# Patient Record
Sex: Female | Born: 1955 | Race: White | Hispanic: No | Marital: Married | Smoking: Never smoker
Health system: Southern US, Community
[De-identification: ages and names within clinical notes are randomized; demographics above are authoritative.]

## PROBLEM LIST (undated history)

## (undated) DIAGNOSIS — E559 Vitamin D deficiency, unspecified: Secondary | ICD-10-CM

## (undated) DIAGNOSIS — G4730 Sleep apnea, unspecified: Secondary | ICD-10-CM

## (undated) DIAGNOSIS — K9041 Non-celiac gluten sensitivity: Secondary | ICD-10-CM

## (undated) DIAGNOSIS — N814 Uterovaginal prolapse, unspecified: Secondary | ICD-10-CM

## (undated) DIAGNOSIS — G20 Parkinson's disease: Secondary | ICD-10-CM

## (undated) DIAGNOSIS — K219 Gastro-esophageal reflux disease without esophagitis: Secondary | ICD-10-CM

## (undated) DIAGNOSIS — N6019 Diffuse cystic mastopathy of unspecified breast: Secondary | ICD-10-CM

## (undated) DIAGNOSIS — G20C Parkinsonism, unspecified: Secondary | ICD-10-CM

## (undated) DIAGNOSIS — N63 Unspecified lump in unspecified breast: Secondary | ICD-10-CM

## (undated) DIAGNOSIS — Z800 Family history of malignant neoplasm of digestive organs: Secondary | ICD-10-CM

## (undated) DIAGNOSIS — E785 Hyperlipidemia, unspecified: Secondary | ICD-10-CM

## (undated) DIAGNOSIS — F329 Major depressive disorder, single episode, unspecified: Secondary | ICD-10-CM

## (undated) DIAGNOSIS — R413 Other amnesia: Secondary | ICD-10-CM

## (undated) DIAGNOSIS — R259 Unspecified abnormal involuntary movements: Secondary | ICD-10-CM

## (undated) DIAGNOSIS — M109 Gout, unspecified: Secondary | ICD-10-CM

## (undated) DIAGNOSIS — M1120 Other chondrocalcinosis, unspecified site: Secondary | ICD-10-CM

## (undated) DIAGNOSIS — K4090 Unilateral inguinal hernia, without obstruction or gangrene, not specified as recurrent: Secondary | ICD-10-CM

## (undated) DIAGNOSIS — R51 Headache: Secondary | ICD-10-CM

## (undated) DIAGNOSIS — M8580 Other specified disorders of bone density and structure, unspecified site: Secondary | ICD-10-CM

## (undated) DIAGNOSIS — R0789 Other chest pain: Secondary | ICD-10-CM

## (undated) DIAGNOSIS — F32A Depression, unspecified: Secondary | ICD-10-CM

## (undated) DIAGNOSIS — D649 Anemia, unspecified: Secondary | ICD-10-CM

## (undated) DIAGNOSIS — G4700 Insomnia, unspecified: Secondary | ICD-10-CM

## (undated) DIAGNOSIS — R0600 Dyspnea, unspecified: Secondary | ICD-10-CM

## (undated) DIAGNOSIS — Z803 Family history of malignant neoplasm of breast: Secondary | ICD-10-CM

## (undated) DIAGNOSIS — R519 Headache, unspecified: Secondary | ICD-10-CM

## (undated) DIAGNOSIS — F419 Anxiety disorder, unspecified: Secondary | ICD-10-CM

## (undated) DIAGNOSIS — M542 Cervicalgia: Secondary | ICD-10-CM

## (undated) DIAGNOSIS — Z8489 Family history of other specified conditions: Secondary | ICD-10-CM

## (undated) DIAGNOSIS — Z9889 Other specified postprocedural states: Secondary | ICD-10-CM

## (undated) DIAGNOSIS — K639 Disease of intestine, unspecified: Secondary | ICD-10-CM

## (undated) DIAGNOSIS — R29818 Other symptoms and signs involving the nervous system: Secondary | ICD-10-CM

## (undated) DIAGNOSIS — R112 Nausea with vomiting, unspecified: Secondary | ICD-10-CM

## (undated) HISTORY — PX: ABDOMINAL HYSTERECTOMY: SHX81

## (undated) HISTORY — PX: COLPOPEXY: SHX920

## (undated) HISTORY — DX: Diffuse cystic mastopathy of unspecified breast: N60.19

## (undated) HISTORY — DX: Other chondrocalcinosis, unspecified site: M11.20

## (undated) HISTORY — DX: Depression, unspecified: F32.A

## (undated) HISTORY — PX: OTHER SURGICAL HISTORY: SHX169

## (undated) HISTORY — DX: Family history of malignant neoplasm of digestive organs: Z80.0

## (undated) HISTORY — DX: Gout, unspecified: M10.9

## (undated) HISTORY — DX: Headache: R51

## (undated) HISTORY — PX: CHOLECYSTECTOMY: SHX55

## (undated) HISTORY — DX: Headache, unspecified: R51.9

## (undated) HISTORY — DX: Disease of intestine, unspecified: K63.9

## (undated) HISTORY — DX: Hyperlipidemia, unspecified: E78.5

## (undated) HISTORY — DX: Family history of malignant neoplasm of breast: Z80.3

## (undated) HISTORY — DX: Unspecified lump in unspecified breast: N63.0

## (undated) HISTORY — PX: CYSTOURETHROSCOPY: SHX476

## (undated) HISTORY — DX: Anxiety disorder, unspecified: F41.9

## (undated) HISTORY — DX: Major depressive disorder, single episode, unspecified: F32.9

---

## 1990-11-27 HISTORY — PX: BREAST LUMPECTOMY: SHX2

## 1996-11-27 DIAGNOSIS — K639 Disease of intestine, unspecified: Secondary | ICD-10-CM

## 1996-11-27 HISTORY — DX: Disease of intestine, unspecified: K63.9

## 2002-11-27 DIAGNOSIS — M109 Gout, unspecified: Secondary | ICD-10-CM

## 2002-11-27 HISTORY — DX: Gout, unspecified: M10.9

## 2004-11-27 HISTORY — PX: BREAST BIOPSY: SHX20

## 2005-01-20 ENCOUNTER — Ambulatory Visit: Payer: Self-pay | Admitting: General Surgery

## 2005-08-25 ENCOUNTER — Ambulatory Visit: Payer: Self-pay | Admitting: General Surgery

## 2005-12-06 ENCOUNTER — Ambulatory Visit: Payer: Self-pay | Admitting: General Surgery

## 2006-10-05 ENCOUNTER — Ambulatory Visit: Payer: Self-pay | Admitting: Family Medicine

## 2006-11-27 HISTORY — PX: COLONOSCOPY: SHX174

## 2006-12-10 ENCOUNTER — Ambulatory Visit: Payer: Self-pay | Admitting: General Surgery

## 2007-06-18 ENCOUNTER — Ambulatory Visit: Payer: Self-pay | Admitting: General Surgery

## 2007-11-28 HISTORY — PX: BIOPSY BREAST: PRO8

## 2007-12-06 ENCOUNTER — Ambulatory Visit: Payer: Self-pay | Admitting: General Surgery

## 2008-12-02 ENCOUNTER — Ambulatory Visit: Payer: Self-pay | Admitting: General Surgery

## 2009-12-09 ENCOUNTER — Ambulatory Visit: Payer: Self-pay | Admitting: General Surgery

## 2010-03-01 ENCOUNTER — Encounter: Payer: Self-pay | Admitting: Internal Medicine

## 2010-03-07 ENCOUNTER — Encounter: Payer: Self-pay | Admitting: Internal Medicine

## 2010-03-21 ENCOUNTER — Ambulatory Visit: Payer: Self-pay | Admitting: Internal Medicine

## 2010-03-21 DIAGNOSIS — R109 Unspecified abdominal pain: Secondary | ICD-10-CM | POA: Insufficient documentation

## 2010-03-24 ENCOUNTER — Ambulatory Visit: Payer: Self-pay | Admitting: Internal Medicine

## 2010-03-24 ENCOUNTER — Encounter: Payer: Self-pay | Admitting: Internal Medicine

## 2010-03-24 ENCOUNTER — Ambulatory Visit: Payer: Self-pay | Admitting: Cardiology

## 2010-04-01 ENCOUNTER — Telehealth: Payer: Self-pay | Admitting: Cardiovascular Disease

## 2010-04-06 ENCOUNTER — Encounter: Payer: Self-pay | Admitting: Internal Medicine

## 2010-04-19 ENCOUNTER — Ambulatory Visit: Payer: Self-pay | Admitting: Neurology

## 2010-11-27 DIAGNOSIS — N63 Unspecified lump in unspecified breast: Secondary | ICD-10-CM

## 2010-11-27 HISTORY — DX: Unspecified lump in unspecified breast: N63.0

## 2010-12-12 ENCOUNTER — Ambulatory Visit: Payer: Self-pay | Admitting: General Surgery

## 2010-12-27 NOTE — Progress Notes (Signed)
Summary: PHI  PHI   Imported By: Harlon Flor 03/22/2010 14:12:23  _____________________________________________________________________  External Attachment:    Type:   Image     Comment:   External Document

## 2010-12-27 NOTE — Assessment & Plan Note (Signed)
Summary: NP6/AMD   Referring Provider:  Jennell Corner Primary Provider:  Elizabeth Sauer  CC:  Chest pain and neck pain-last episode 3 days; Headaches;.  History of Present Illness: Margaret Roberson is a 55 y/o woman with h/o hyperlipidemia but no known h/o heart disease. Referred by Dr. Yetta Barre for further evaluation of CP.   Has never had a stress test or cath. In 1994 wore a heart monitor for tachypalpitations. Took medications for a while but then stopped.  Under a lot of stress lately home schooling her teenage son and also taking care of her husband who had a stroke in 2009 and is now on disablity.   Over past 2 months has had episodes of feeling dizzy and lightheaded. Can happen at anytime. Sits down and takes a break and then goes away. Over same time has had "pinching" feeling in chest and can occasionally get a pain in her neck which can with her chest pinching or by itself. Can occur with exertion or at rest. Occasionally happens at night and can wake her up from sleep with clutching pain. Denies acid reflux. Overall feels symptoms are about the same over past 2 months.   Saw Dr. Feliberto Gottron who referred to [place] to evalaute her CP.  Preventive Screening-Counseling & Management  Alcohol-Tobacco     Smoking Status: never  Caffeine-Diet-Exercise     Caffeine use/day: 1  Current Medications (verified): 1)  Vitamin D (Ergocalciferol) 50000 Unit Caps (Ergocalciferol) .... Take 1 By Mouth Every Week For 6 Weeks 2)  Toviaz 4 Mg Xr24h-Tab (Fesoterodine Fumarate) .... Take 1 By Mouth Once Daily 3)  Tylenol Extra Strength 500 Mg Tabs (Acetaminophen) .... As Needed 4)  Womens Menopause Vita Pak  Misc (Specialty Vitamins Products) .... Once Daily 5)  Niacin 250 Mg Tabs (Niacin) .... Take 1 By Mouth Two Times A Day 6)  Fish Oil 1000 Mg Caps (Omega-3 Fatty Acids) .... Take 1 By Mouth Three Times A Day  Allergies: 1)  ! Sulfa 2)  ! Penicillin  Past History:  Past Medical History: 1.  Headache disorder, right-sided, etiology unknown. 2. Urinary incontinence. 3. Hyperlipidemia. 4 Fibrocystic changes to her breasts. 5. Pseudogout  Past Surgical History: Lumpectomy (L) 1992- BENIGN C-Section: 1994 (R) Breast Biopsy: 2009  Family History: Family History of Coronary Artery Disease:  Brother with heart disease and dissecting abdominal aneurysm age 71 Mother with a heart attack age 15  Family History of CVA or Stroke:  Brother age 52  Family History of Cancer:  Sister with breast cancer Brother with colon cancer  Social History: Caffeine use/day:  1  Review of Systems       As per HPI and past medical history; otherwise all systems negative.   Vital Signs:  Patient profile:   55 year old female Height:      62 inches Weight:      157 pounds BMI:     28.82 Pulse rate:   96 / minute BP sitting:   110 / 70  (left arm)  Vitals Entered By: Stanton Kidney, EMT-P (March 21, 2010 2:28 PM)  Physical Exam  General:  Gen: well appearing. no resp difficulty HEENT: normal Neck: supple. no JVD. Carotids 2+ bilat; no bruits. No lymphadenopathy or thryomegaly appreciated. Cor: PMI nondisplaced. Regular rate & rhythm. No rubs, gallops, murmur. Lungs: clear Abdomen: soft, nontender, nondistended. No hepatosplenomegaly. No bruits or masses. Good bowel sounds. Extremities: no cyanosis, clubbing, rash, edema Neuro: alert & orientedx3, cranial nerves grossly intact. moves  all 4 extremities w/o difficulty. affect pleasant - mildly stressed    Impression & Recommendations:  Problem # 1:  CHEST TIGHTNESS-PRESSURE-OTHER ([id number]) Chest pain with moswtly atypical features however given risk factors feel it is prudent to proceed with treadmill myoview to further evalaute.   Other Orders: T-Pregnancy (Serum), Quant. [phone]) T-Pregnancy (Serum), Quant. [phone]) Nuclear Stress Test (Nuc Stress Test)  Patient Instructions: 1)  Your physician recommends that  you schedule a follow-up appointment in: 2 months 2)  Your physician recommends that you continue on your current medications as directed. Please refer to the Current Medication list given to you today. 3)  Your physician has requested that you have an exercise stress myoview.  For further information please visit [email or web address]  Nothing to eat or drink after midnight.  Please wear comfortable clothes, short sleeved shirt, comfortable shoes. Please arrive @ 7:30 am to [facility] medical mall entrance.  You will be there approx. 2-3 hours.

## 2010-12-27 NOTE — Progress Notes (Signed)
Summary: RESULTS  Phone Note Call from Patient Call back at Home Phone [phone]   Caller: SELF Call For: [facility] Summary of Call: PT WOULD LIKE HER STRESS TEST RESULTS FROM 4/27 Initial call taken by: Harlon Flor,  Apr 01, 2010 1:49 PM  Follow-up for Phone Call        PT CALLED BACK STATING SHE STILL HAD NOT RECEIVED RESULTS OF TEST Follow-up by: Park Breed,  Apr 04, 2010 12:10 PM    Additional Follow-up for Phone Call Additional follow up Details #2::    Stress test results not in computer. Was it done at [place] or [place]? Tim   Appended Document: RESULTS Spoke with pt advised results not read yet.  Test done at [facility] 03/23/10.  Advised WCB once results read.  Cell # alternate if not at home # [phone]. EWJ  Appended Document: RESULTS Called spoke with pt advised of normal [place] Myoview results.  EWJ

## 2010-12-27 NOTE — Progress Notes (Signed)
Summary: [facility]   Imported By: Harlon Flor 03/22/2010 14:11:14  _____________________________________________________________________  External Attachment:    Type:   Image     Comment:   External Document

## 2010-12-27 NOTE — Miscellaneous (Signed)
Summary: Myoview results [facility]  Clinical Lists Changes  Observations: Added new observation of NUCST CONC: Normal LV systolic function.  Noevidence for ischemia or infarction by perfusion images.  EKG stress also showed no evidence for ischemia.  However, the patient did develop chest discomfort at peak stress that persisted four minutes into recovery before resolving.  Therefore, the patient had reproduction of her symptoms; however, there was no evidence for ischemia by EKG or Myoview. (03/24/2010 12:29)      Nuclear ETT  Procedure date:  03/24/2010  Findings:      Normal LV systolic function.  Noevidence for ischemia or infarction by perfusion images.  EKG stress also showed no evidence for ischemia.  However, the patient did develop chest discomfort at peak stress that persisted four minutes into recovery before resolving.  Therefore, the patient had reproduction of her symptoms; however, there was no evidence for ischemia by EKG or Myoview.

## 2011-06-15 ENCOUNTER — Encounter: Payer: Self-pay | Admitting: Cardiovascular Disease

## 2012-01-03 ENCOUNTER — Ambulatory Visit: Payer: Self-pay | Admitting: General Surgery

## 2013-01-03 ENCOUNTER — Ambulatory Visit: Payer: Self-pay | Admitting: General Surgery

## 2013-06-02 ENCOUNTER — Encounter: Payer: Self-pay | Admitting: *Deleted

## 2013-06-02 DIAGNOSIS — N63 Unspecified lump in unspecified breast: Secondary | ICD-10-CM | POA: Insufficient documentation

## 2014-01-09 ENCOUNTER — Encounter: Payer: Self-pay | Admitting: General Surgery

## 2014-01-14 ENCOUNTER — Ambulatory Visit: Payer: Self-pay | Admitting: General Surgery

## 2014-02-03 ENCOUNTER — Encounter: Payer: Self-pay | Admitting: *Deleted

## 2014-03-09 DIAGNOSIS — M542 Cervicalgia: Secondary | ICD-10-CM

## 2014-03-09 HISTORY — DX: Cervicalgia: M54.2

## 2014-09-28 ENCOUNTER — Encounter: Payer: Self-pay | Admitting: *Deleted

## 2015-03-22 HISTORY — PX: COLPORRHAPHY: SHX921

## 2016-01-20 ENCOUNTER — Other Ambulatory Visit: Payer: Self-pay | Admitting: Family Medicine

## 2016-01-20 DIAGNOSIS — Z1231 Encounter for screening mammogram for malignant neoplasm of breast: Secondary | ICD-10-CM

## 2016-05-31 ENCOUNTER — Other Ambulatory Visit: Payer: Self-pay | Admitting: Neurology

## 2016-05-31 DIAGNOSIS — G43119 Migraine with aura, intractable, without status migrainosus: Secondary | ICD-10-CM

## 2016-06-20 ENCOUNTER — Ambulatory Visit: Payer: BLUE CROSS/BLUE SHIELD

## 2016-07-10 ENCOUNTER — Ambulatory Visit: Payer: BLUE CROSS/BLUE SHIELD

## 2016-07-27 ENCOUNTER — Other Ambulatory Visit: Payer: Self-pay | Admitting: Student

## 2016-07-27 DIAGNOSIS — R1310 Dysphagia, unspecified: Secondary | ICD-10-CM

## 2016-08-03 ENCOUNTER — Ambulatory Visit
Admission: RE | Admit: 2016-08-03 | Discharge: 2016-08-03 | Disposition: A | Discharge status: Home / Self Care | Payer: BLUE CROSS/BLUE SHIELD | Source: Ambulatory Visit | Attending: Student | Admitting: Student

## 2016-08-03 DIAGNOSIS — K449 Diaphragmatic hernia without obstruction or gangrene: Secondary | ICD-10-CM | POA: Insufficient documentation

## 2016-08-03 DIAGNOSIS — R1310 Dysphagia, unspecified: Secondary | ICD-10-CM | POA: Insufficient documentation

## 2016-10-03 ENCOUNTER — Encounter: Payer: Self-pay | Admitting: *Deleted

## 2016-10-04 ENCOUNTER — Encounter
Admission: RE | Disposition: A | Discharge status: Home / Self Care | Payer: Self-pay | Source: Ambulatory Visit | Attending: Unknown Physician Specialty

## 2016-10-04 ENCOUNTER — Ambulatory Visit: Payer: BLUE CROSS/BLUE SHIELD | Admitting: Certified Registered Nurse Anesthetist

## 2016-10-04 ENCOUNTER — Encounter: Payer: Self-pay | Admitting: *Deleted

## 2016-10-04 ENCOUNTER — Ambulatory Visit
Admission: RE | Admit: 2016-10-04 | Discharge: 2016-10-04 | Disposition: A | Discharge status: Home / Self Care | Payer: BLUE CROSS/BLUE SHIELD | Source: Ambulatory Visit | Attending: Unknown Physician Specialty | Admitting: Unknown Physician Specialty

## 2016-10-04 DIAGNOSIS — Z880 Allergy status to penicillin: Secondary | ICD-10-CM | POA: Insufficient documentation

## 2016-10-04 DIAGNOSIS — K219 Gastro-esophageal reflux disease without esophagitis: Secondary | ICD-10-CM | POA: Diagnosis not present

## 2016-10-04 DIAGNOSIS — Z7982 Long term (current) use of aspirin: Secondary | ICD-10-CM | POA: Insufficient documentation

## 2016-10-04 DIAGNOSIS — R1310 Dysphagia, unspecified: Secondary | ICD-10-CM | POA: Insufficient documentation

## 2016-10-04 DIAGNOSIS — Z79899 Other long term (current) drug therapy: Secondary | ICD-10-CM | POA: Insufficient documentation

## 2016-10-04 DIAGNOSIS — K2900 Acute gastritis without bleeding: Secondary | ICD-10-CM | POA: Insufficient documentation

## 2016-10-04 DIAGNOSIS — E785 Hyperlipidemia, unspecified: Secondary | ICD-10-CM | POA: Diagnosis not present

## 2016-10-04 HISTORY — PX: ESOPHAGOGASTRODUODENOSCOPY (EGD) WITH PROPOFOL: SHX5813

## 2016-10-04 HISTORY — DX: Sleep apnea, unspecified: G47.30

## 2016-10-04 HISTORY — DX: Cervicalgia: M54.2

## 2016-10-04 HISTORY — DX: Vitamin D deficiency, unspecified: E55.9

## 2016-10-04 HISTORY — DX: Other specified disorders of bone density and structure, unspecified site: M85.80

## 2016-10-04 SURGERY — ESOPHAGOGASTRODUODENOSCOPY (EGD) WITH PROPOFOL
Anesthesia: General

## 2016-10-04 MED ORDER — SODIUM CHLORIDE 0.9 % IV SOLN
INTRAVENOUS | Status: DC
  Administered 2016-10-04: 1000 mL via INTRAVENOUS

## 2016-10-04 MED ORDER — PROPOFOL 10 MG/ML IV BOLUS
INTRAVENOUS | Status: DC | PRN
  Administered 2016-10-04: 20 mg via INTRAVENOUS
  Administered 2016-10-04: 30 mg via INTRAVENOUS
  Administered 2016-10-04 (×2): 20 mg via INTRAVENOUS

## 2016-10-04 MED ORDER — LIDOCAINE HCL (CARDIAC) 20 MG/ML IV SOLN
INTRAVENOUS | Status: DC | PRN
  Administered 2016-10-04: 30 mg via INTRAVENOUS

## 2016-10-04 MED ORDER — PROPOFOL 500 MG/50ML IV EMUL
INTRAVENOUS | Status: DC | PRN
  Administered 2016-10-04: 130 ug/kg/min via INTRAVENOUS

## 2016-10-04 MED ORDER — MIDAZOLAM HCL 2 MG/2ML IJ SOLN
INTRAMUSCULAR | Status: DC | PRN
  Administered 2016-10-04: 1 mg via INTRAVENOUS

## 2016-10-04 NOTE — Transfer of Care (Signed)
Immediate Anesthesia Transfer of Care Note  Patient: Margaret Roberson  Procedure(s) Performed: Procedure(s): ESOPHAGOGASTRODUODENOSCOPY (EGD) WITH PROPOFOL (N/A)  Patient Location: PACU  Anesthesia Type:General  Level of Consciousness: sedated  Airway & Oxygen Therapy: Patient Spontanous Breathing and Patient connected to nasal cannula oxygen  Post-op Assessment: Report given to RN and Post -op Vital signs reviewed and stable  Post vital signs: Reviewed and stable  Last Vitals:  Vitals:   10/04/16 1434  BP: (!) 115/54  Pulse: 69  Resp: 18  Temp: 36.6 C    Last Pain:  Vitals:   10/04/16 1434  TempSrc: Tympanic  PainSc: 3          Complications: No apparent anesthesia complications

## 2016-10-04 NOTE — Anesthesia Preprocedure Evaluation (Signed)
Anesthesia Evaluation  Patient identified by MRN, date of birth, ID band Patient awake    Reviewed: Allergy & Precautions, NPO status , Patient's Chart, lab work & pertinent test results  Airway Mallampati: III       Dental  (+) Teeth Intact   Pulmonary neg pulmonary ROS,    breath sounds clear to auscultation       Cardiovascular Exercise Tolerance: Good  Rhythm:Regular Rate:Normal     Neuro/Psych Anxiety Depression Parkinson's    GI/Hepatic negative GI ROS, Neg liver ROS,   Endo/Other  negative endocrine ROS  Renal/GU negative Renal ROS     Musculoskeletal negative musculoskeletal ROS (+)   Abdominal Normal abdominal exam  (+)   Peds  Hematology negative hematology ROS (+)   Anesthesia Other Findings   Reproductive/Obstetrics                             Anesthesia Physical Anesthesia Plan  ASA: II  Anesthesia Plan: General   Post-op Pain Management:    Induction: Intravenous  Airway Management Planned: Natural Airway and Nasal Cannula  Additional Equipment:   Intra-op Plan:   Post-operative Plan:   Informed Consent:   Plan Discussed with: CRNA  Anesthesia Plan Comments:         Anesthesia Quick Evaluation

## 2016-10-04 NOTE — Op Note (Signed)
[facility] Patient Name: Margaret Roberson Procedure Date: 10/04/2016 3:38 PM MRN: [id number] Account #: [id number] Date of Birth: March 13, 1956 Admit Type: Outpatient Age: 60 Room: [facility] ENDO ROOM 4 Gender: Female Note Status: Finalized Procedure:            Upper GI endoscopy Indications:          Dysphagia, Heartburn Providers:            Manya Silvas, MD Referring MD:         Sofie Hartigan (Referring MD) Medicines:            Propofol per Anesthesia Complications:        No immediate complications. Procedure:            Pre-Anesthesia Assessment:                       - After reviewing the risks and benefits, the patient                        was deemed in satisfactory condition to undergo the                        procedure.                       After obtaining informed consent, the endoscope was                        passed under direct vision. Throughout the procedure,                        the patient's blood pressure, pulse, and oxygen                        saturations were monitored continuously. The Endoscope                        was introduced through the mouth, and advanced to the                        second part of duodenum. The upper GI endoscopy was                        accomplished without difficulty. The patient tolerated                        the procedure well. Findings:      The examined esophagus was normal.      Patchy moderate inflammation characterized by erosions, erythema and       linear erosions was found in the prepyloric region of the stomach.       Biopsies were taken with a cold forceps for histology. Biopsies were       taken with a cold forceps for Helicobacter pylori testing.      Three non-bleeding linear and superficial gastric ulcers with pigmented       material were found on the lesser curvature of the gastric antrum.       Biopsies were taken with a cold forceps for histology. Biopsies  were       taken with a cold forceps for Helicobacter pylori testing.  The examined duodenum was normal.      At the end of the test I passed a guide wire into the duodenum and       removed scope and passed a Savary 48 F dilator over the guide wire. Impression:           - Normal esophagus.                       - Gastritis. Biopsied.                       - Non-bleeding gastric ulcers with pigmented material.                        Biopsied.                       - Normal examined duodenum. Recommendation:       - Await pathology results. Take medicine for acid                        reduction. Manya Silvas, MD 10/04/2016 4:00:18 PM This report has been signed electronically. Number of Addenda: 0 Note Initiated On: 10/04/2016 3:38 PM      [facility]

## 2016-10-04 NOTE — Anesthesia Procedure Notes (Signed)
Date/Time: 10/04/2016 3:40 PM Performed by: Johnna Acosta Pre-anesthesia Checklist: Patient identified, Emergency Drugs available, Patient being monitored, Suction available and Timeout performed Patient Re-evaluated:Patient Re-evaluated prior to inductionOxygen Delivery Method: Nasal cannula

## 2016-10-05 ENCOUNTER — Encounter: Payer: Self-pay | Admitting: Unknown Physician Specialty

## 2016-10-05 NOTE — Anesthesia Postprocedure Evaluation (Signed)
Anesthesia Post Note  Patient: Margaret Roberson  Procedure(s) Performed: Procedure(s) (LRB): ESOPHAGOGASTRODUODENOSCOPY (EGD) WITH PROPOFOL (N/A)  Patient location during evaluation: PACU Anesthesia Type: General Level of consciousness: awake Pain management: pain level controlled Vital Signs Assessment: post-procedure vital signs reviewed and stable Respiratory status: nonlabored ventilation Cardiovascular status: stable Anesthetic complications: no    Last Vitals:  Vitals:   10/04/16 1619 10/04/16 1629  BP: 127/83 118/70  Pulse: 73 74  Resp: 19 18  Temp:      Last Pain:  Vitals:   10/04/16 1559  TempSrc: Tympanic  PainSc: Asleep                 VAN STAVEREN,[place]

## 2016-10-06 LAB — SURGICAL PATHOLOGY

## 2016-10-10 NOTE — H&P (Signed)
Primary Care Physician:  [facility], MD Primary Gastroenterologist:  Dr. Vira Agar  Pre-Procedure History & Physical: HPI:  Margaret Roberson is a 60 y.o. female is here for an endoscopy.   Past Medical History:  Diagnosis Date  . Anxiety   . Bowel trouble 1998  . Cervicalgia 03/09/2014  . Depression   . Diffuse cystic mastopathy   . Family history of malignant neoplasm of breast   . Family history of malignant neoplasm of gastrointestinal tract   . Fibrocystic breast changes   . Gout 2004   pseudogout  . Headache disorder    R sided, etiology unknown   . HLD (hyperlipidemia)   . Lump or mass in breast 2012  . Osteopenia   . Pseudogout   . Sleep apnea   . Urinary incontinence   . Vitamin D deficiency     Past Surgical History:  Procedure Laterality Date  . ABDOMINAL HYSTERECTOMY    . BIOPSY BREAST  2009   R  . BREAST BIOPSY  2006   core bx  . BREAST LUMPECTOMY  1992   L; benign  . CESAREAN SECTION  1994  . COLONOSCOPY  2008   Dr. Jamal Collin  . COLPOPEXY    . COLPORRHAPHY  03/22/2015  . CYSTOURETHROSCOPY    . ESOPHAGOGASTRODUODENOSCOPY (EGD) WITH PROPOFOL N/A 10/04/2016   Procedure: ESOPHAGOGASTRODUODENOSCOPY (EGD) WITH PROPOFOL;  Surgeon: Manya Silvas, MD;  Location: [facility] ENDOSCOPY;  Service: Endoscopy;  Laterality: N/A;  . sling for stress incontinence      Prior to Admission medications   Medication Sig Start Date End Date Taking? Authorizing Provider  aspirin EC 81 MG tablet Take 81 mg by mouth daily.   Yes Historical Provider, MD  baclofen (LIORESAL) 10 MG tablet Take 10 mg by mouth 2 (two) times daily.   Yes Historical Provider, MD  carbidopa-levodopa (SINEMET IR) 25-100 MG tablet Take 1 tablet by mouth 3 (three) times daily.   Yes Historical Provider, MD  cholecalciferol (VITAMIN D) 400 units TABS tablet Take 1,000 Units by mouth daily.   Yes Historical Provider, MD  estradiol-norethindrone (ACTIVELLA) 1-0.5 MG tablet Take 1 tablet by mouth daily.    Yes Historical Provider, MD  FLUoxetine (PROZAC) 40 MG capsule Take 40 mg by mouth daily.   Yes Historical Provider, MD  magnesium oxide (MAG-OX) 400 MG tablet Take 400 mg by mouth daily.   Yes Historical Provider, MD  Omega-3 Fatty Acids (FISH OIL) 1000 MG CAPS Take 1 capsule by mouth 3 (three) times daily.     Yes Historical Provider, MD  pantoprazole (PROTONIX) 40 MG tablet Take 40 mg by mouth daily.   Yes Historical Provider, MD  acetaminophen (TYLENOL) 500 MG tablet Take 500 mg by mouth every 6 (six) hours as needed.      Historical Provider, MD  fesoterodine (TOVIAZ) 4 MG TB24 Take 4 mg by mouth daily.      Historical Provider, MD  niacin 250 MG tablet Take 250 mg by mouth 2 (two) times daily.      Historical Provider, MD  Red Yeast Rice 600 MG CAPS Take 1 capsule by mouth daily.    Historical Provider, MD  rizatriptan (MAXALT) 10 MG tablet Take 10 mg by mouth as needed for migraine. May repeat in 2 hours if needed    Historical Provider, MD  Specialty Vitamins Products ([place] PAK) MISC Take by mouth daily.      Historical Provider, MD  valACYclovir (VALTREX) 500  MG tablet Take 500 mg by mouth 2 (two) times daily.    Historical Provider, MD    Allergies as of 08/14/2016 - Reviewed 03/21/2010  Allergen Reaction Noted  . Penicillins    . Sulfonamide derivatives      Family History  Problem Relation Age of Onset  . Coronary artery disease      family hx  . Heart disease Brother     also has dissecting abdominal aneuysm at 55  . Heart attack Mother 86  . Stroke      family hx  . Breast cancer Sister   . Colon cancer Brother     Social History   Social History  . Marital status: Married    Spouse name: N/A  . Number of children: N/A  . Years of education: N/A   Occupational History  . Not on file.   Social History Main Topics  . Smoking status: Never Smoker  . Smokeless tobacco: Never Used     Comment: tobacco use - no  . Alcohol use No  . Drug use:  No  . Sexual activity: Not on file   Other Topics Concern  . Not on file   Social History Narrative  . No narrative on file    Review of Systems: See HPI, otherwise negative ROS  Physical Exam: BP 118/70   Pulse 74   Temp 97 F (36.1 C) (Tympanic)   Resp 18   Ht 5\' 2"  (1.575 m)   Wt 54.4 kg (120 lb)   SpO2 100%   BMI 21.95 kg/m  General:   Alert,  pleasant and cooperative in NAD Head:  Normocephalic and atraumatic. Neck:  Supple; no masses or thyromegaly. Lungs:  Clear throughout to auscultation.    Heart:  Regular rate and rhythm. Abdomen:  Soft, nontender and nondistended. Normal bowel sounds, without guarding, and without rebound.   Neurologic:  Alert and  oriented x4;  grossly normal neurologically.  Impression/Plan: Margaret Roberson is here for an endoscopy to be performed for dysphagia and heartburn  Risks, benefits, limitations, and alternatives regarding  endoscopy have been reviewed with the patient.  Questions have been answered.  All parties agreeable.   Gaylyn Cheers, MD  10/10/2016, 12:16 PM

## 2017-06-01 ENCOUNTER — Ambulatory Visit
Admission: RE | Admit: 2017-06-01 | Discharge: 2017-06-01 | Disposition: A | Discharge status: Home / Self Care | Payer: BLUE CROSS/BLUE SHIELD | Source: Ambulatory Visit | Attending: Family Medicine | Admitting: Family Medicine

## 2017-06-01 ENCOUNTER — Other Ambulatory Visit: Payer: Self-pay | Admitting: Family Medicine

## 2017-06-01 DIAGNOSIS — S0990XA Unspecified injury of head, initial encounter: Secondary | ICD-10-CM | POA: Insufficient documentation

## 2017-06-01 DIAGNOSIS — W19XXXA Unspecified fall, initial encounter: Secondary | ICD-10-CM

## 2017-06-01 DIAGNOSIS — X58XXXA Exposure to other specified factors, initial encounter: Secondary | ICD-10-CM | POA: Insufficient documentation

## 2019-01-27 ENCOUNTER — Ambulatory Visit: Payer: BLUE CROSS/BLUE SHIELD | Admitting: Occupational Therapy

## 2019-02-03 ENCOUNTER — Encounter: Payer: BLUE CROSS/BLUE SHIELD | Admitting: Occupational Therapy

## 2019-02-04 ENCOUNTER — Encounter: Payer: BLUE CROSS/BLUE SHIELD | Admitting: Occupational Therapy

## 2019-02-05 ENCOUNTER — Encounter: Payer: BLUE CROSS/BLUE SHIELD | Admitting: Occupational Therapy

## 2019-02-06 ENCOUNTER — Encounter: Payer: BLUE CROSS/BLUE SHIELD | Admitting: Occupational Therapy

## 2019-02-10 ENCOUNTER — Encounter: Payer: BLUE CROSS/BLUE SHIELD | Admitting: Occupational Therapy

## 2019-02-11 ENCOUNTER — Encounter: Payer: BLUE CROSS/BLUE SHIELD | Admitting: Occupational Therapy

## 2019-02-12 ENCOUNTER — Encounter: Payer: BLUE CROSS/BLUE SHIELD | Admitting: Occupational Therapy

## 2019-02-13 ENCOUNTER — Encounter: Payer: BLUE CROSS/BLUE SHIELD | Admitting: Occupational Therapy

## 2019-02-17 ENCOUNTER — Encounter: Payer: BLUE CROSS/BLUE SHIELD | Admitting: Occupational Therapy

## 2019-02-18 ENCOUNTER — Encounter: Payer: BLUE CROSS/BLUE SHIELD | Admitting: Occupational Therapy

## 2019-02-19 ENCOUNTER — Encounter: Payer: BLUE CROSS/BLUE SHIELD | Admitting: Occupational Therapy

## 2019-02-20 ENCOUNTER — Encounter: Payer: BLUE CROSS/BLUE SHIELD | Admitting: Occupational Therapy

## 2019-02-24 ENCOUNTER — Encounter: Payer: BLUE CROSS/BLUE SHIELD | Admitting: Occupational Therapy

## 2019-02-25 ENCOUNTER — Encounter: Payer: BLUE CROSS/BLUE SHIELD | Admitting: Occupational Therapy

## 2019-02-26 ENCOUNTER — Encounter: Payer: BLUE CROSS/BLUE SHIELD | Admitting: Occupational Therapy

## 2019-02-27 ENCOUNTER — Encounter: Payer: BLUE CROSS/BLUE SHIELD | Admitting: Occupational Therapy

## 2020-01-08 ENCOUNTER — Other Ambulatory Visit: Payer: Self-pay | Admitting: Family Medicine

## 2020-01-08 DIAGNOSIS — R2242 Localized swelling, mass and lump, left lower limb: Secondary | ICD-10-CM

## 2020-01-08 DIAGNOSIS — R2241 Localized swelling, mass and lump, right lower limb: Secondary | ICD-10-CM

## 2020-01-13 ENCOUNTER — Ambulatory Visit: Admission: RE | Admit: 2020-01-13 | Payer: BLUE CROSS/BLUE SHIELD | Source: Ambulatory Visit

## 2020-01-13 ENCOUNTER — Other Ambulatory Visit: Payer: Self-pay

## 2020-01-13 ENCOUNTER — Ambulatory Visit
Admission: RE | Admit: 2020-01-13 | Discharge: 2020-01-13 | Disposition: A | Discharge status: Home / Self Care | Payer: Medicare Other | Source: Ambulatory Visit | Attending: Family Medicine | Admitting: Family Medicine

## 2020-01-13 DIAGNOSIS — R2241 Localized swelling, mass and lump, right lower limb: Secondary | ICD-10-CM | POA: Insufficient documentation

## 2020-01-13 DIAGNOSIS — R2242 Localized swelling, mass and lump, left lower limb: Secondary | ICD-10-CM | POA: Diagnosis present

## 2020-01-13 IMAGING — US US EXTREM LOW*L* LIMITED
1 series · 4 of 4 positions shown · non-contrast
Comparison: None.

CLINICAL DATA: Posted tender thigh mass.

EXAM:
ULTRASOUND LEFT LOWER EXTREMITY LIMITED
TECHNIQUE: Ultrasound examination of the lower extremity soft tissues was
performed in the area of clinical concern.

[Series 1: us extrem low*left* limited · 0.07mm/px · 4 of 4 slices shown]
[im 1/4]
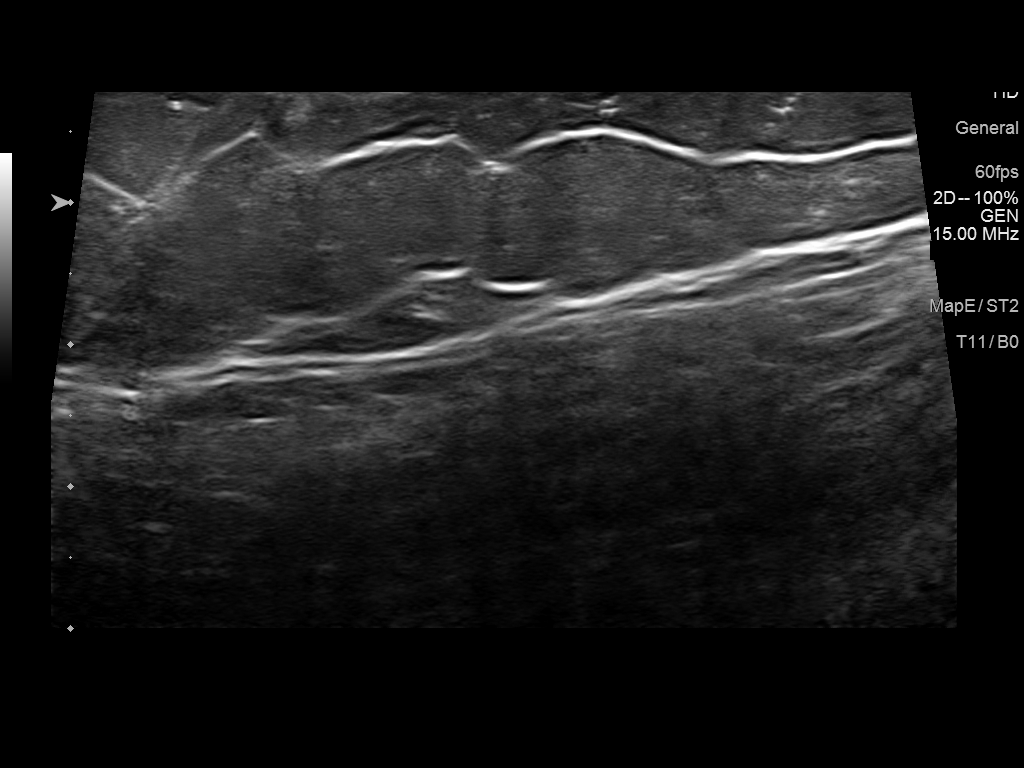
[im 2/4]
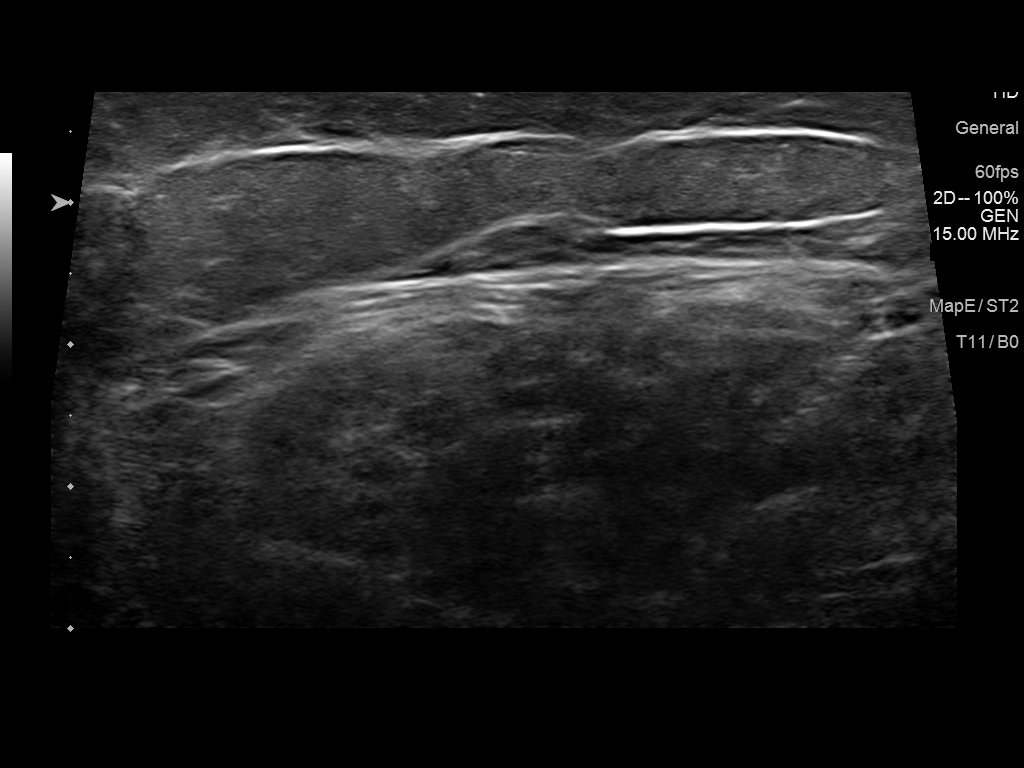
[im 3/4]
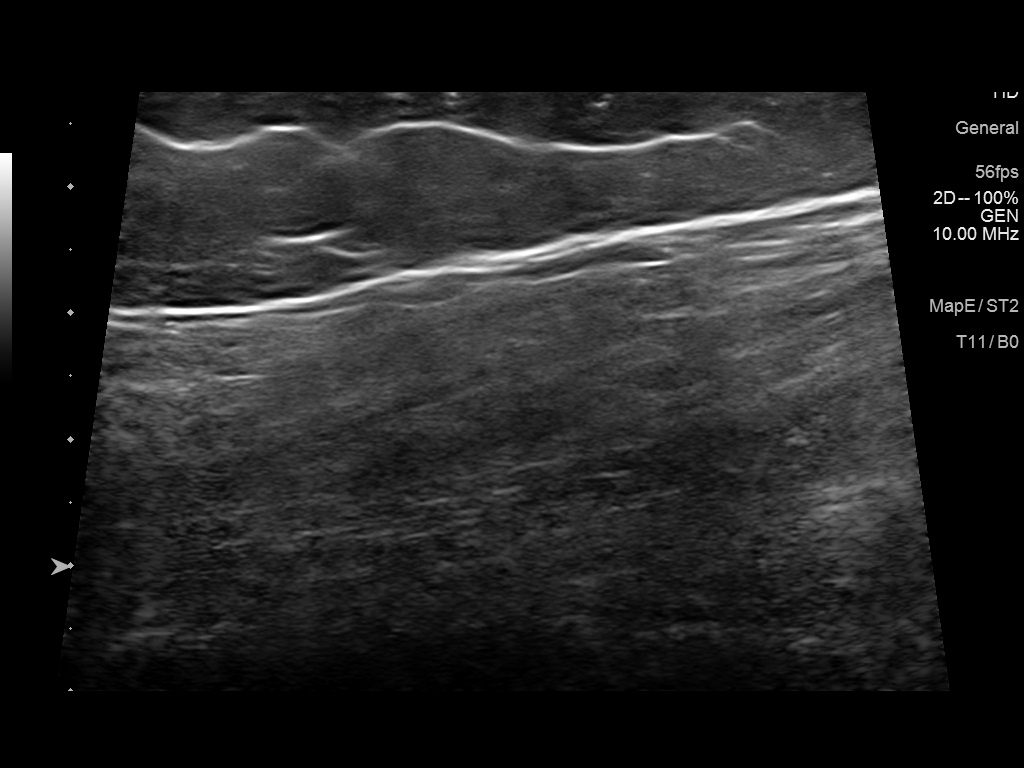
[im 4/4]
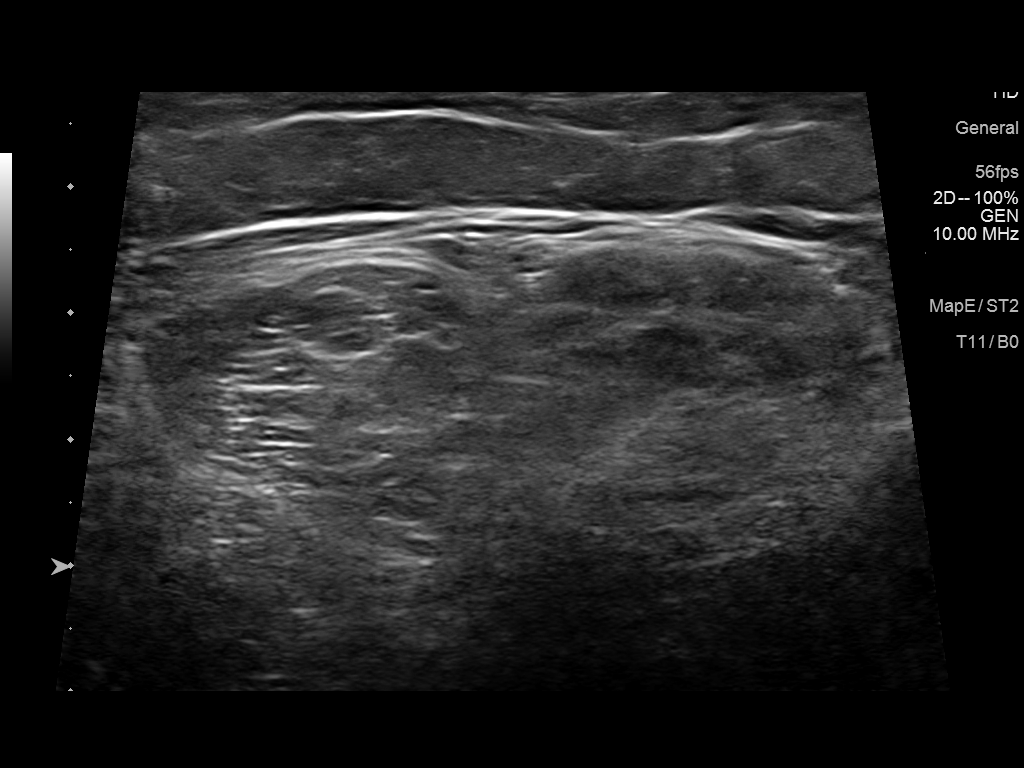

[4 of 4 positions shown; findings below may reference images not displayed]

FINDINGS: In the patient's palpable area of concern, there is no sonographic
abnormality. Specifically there is no mass or fluid collection.
IMPRESSION: No sonographic abnormality detected.

## 2020-11-24 ENCOUNTER — Other Ambulatory Visit: Payer: Self-pay | Admitting: Family Medicine

## 2020-11-24 DIAGNOSIS — Z1231 Encounter for screening mammogram for malignant neoplasm of breast: Secondary | ICD-10-CM

## 2020-11-27 ENCOUNTER — Other Ambulatory Visit: Payer: Self-pay

## 2020-11-27 ENCOUNTER — Ambulatory Visit
Admission: EM | Admit: 2020-11-27 | Discharge: 2020-11-27 | Disposition: A | Discharge status: Home / Self Care | Payer: Medicare Other | Attending: Family Medicine | Admitting: Family Medicine

## 2020-11-27 ENCOUNTER — Encounter: Payer: Self-pay | Admitting: Emergency Medicine

## 2020-11-27 DIAGNOSIS — Z20822 Contact with and (suspected) exposure to covid-19: Secondary | ICD-10-CM | POA: Diagnosis not present

## 2020-11-27 DIAGNOSIS — J069 Acute upper respiratory infection, unspecified: Secondary | ICD-10-CM | POA: Insufficient documentation

## 2020-11-27 DIAGNOSIS — Z79899 Other long term (current) drug therapy: Secondary | ICD-10-CM | POA: Diagnosis not present

## 2020-11-27 LAB — RESP PANEL BY RT-PCR (FLU A&B, COVID) ARPGX2
Influenza A by PCR: NEGATIVE
Influenza B by PCR: NEGATIVE
SARS Coronavirus 2 by RT PCR: NEGATIVE

## 2020-11-27 MED ORDER — PROMETHAZINE-DM 6.25-15 MG/5ML PO SYRP: 5.0000 mL | ORAL_SOLUTION | Freq: Four times a day (QID) | ORAL | 0 refills | Status: DC | PRN

## 2020-11-27 MED ORDER — AEROCHAMBER MV MISC: 2 refills | Status: DC

## 2020-11-27 MED ORDER — BENZONATATE 100 MG PO CAPS: 200.0000 mg | ORAL_CAPSULE | Freq: Three times a day (TID) | ORAL | 0 refills | Status: DC

## 2020-11-27 MED ORDER — ALBUTEROL SULFATE HFA 108 (90 BASE) MCG/ACT IN AERS: 2.0000 | INHALATION_SPRAY | RESPIRATORY_TRACT | 0 refills | Status: AC | PRN

## 2020-11-27 NOTE — ED Triage Notes (Signed)
Patient c/o cough, body aches, fever that started 3 days ago.

## 2020-11-27 NOTE — Discharge Instructions (Addendum)
Use the albuterol inhaler, 2 puffs with a spacer every 4-6 hours, as needed for shortness of breath and cough.  Take the [facility] during the day as needed for cough. You can take them every 8 hours. Take them with a small sip of water. They may give you some numbness to the base of your tongue or a metallic taste in her mouth, this is normal.  Use the Promethazine DM cough syrup at bedtime as needed for cough. This will make you drowsy.  If you develop any new or worsening symptoms return for reevaluation or see your primary care provider.

## 2020-11-27 NOTE — ED Provider Notes (Signed)
[facility]    CSN: [id number] Arrival date & time: 11/27/20  0809      History   Chief Complaint Chief Complaint  Patient presents with  . Cough  . Generalized Body Aches  . Fever    HPI Margaret Roberson is a 65 y.o. female.   HPI   65 year old female here for evaluation of fever, headache, body aches, and cough.  Patient reports that her symptoms started 3 days ago.  Her max temperature will recorded as 100.8.  She states that she has had some associated wheezing and feels a rattle in her chest.  Her cough is nonproductive.  Patient denies runny nose, sore throat, ear pain or pressure, GI complaints, sick contacts.  Patient states that she lost her sense of taste and smell in July and has not fully returned.  She was Covid positive at that time.  Patient has not received her Covid vaccine or her flu vaccine.  Past Medical History:  Diagnosis Date  . Anxiety   . Bowel trouble 1998  . Cervicalgia 03/09/2014  . Depression   . Diffuse cystic mastopathy   . Family history of malignant neoplasm of breast   . Family history of malignant neoplasm of gastrointestinal tract   . Fibrocystic breast changes   . Gout 2004   pseudogout  . Headache disorder    R sided, etiology unknown   . HLD (hyperlipidemia)   . Lump or mass in breast 2012  . Osteopenia   . Pseudogout   . Sleep apnea   . Urinary incontinence   . Vitamin D deficiency     Patient Active Problem List   Diagnosis Date Noted  . Lump or mass in breast   . ABDOMINAL PAIN, UNSPECIFIED SITE 03/21/2010    Past Surgical History:  Procedure Laterality Date  . ABDOMINAL HYSTERECTOMY    . BIOPSY BREAST  2009   R  . BREAST BIOPSY  2006   core bx  . BREAST LUMPECTOMY  1992   L; benign  . CESAREAN SECTION  1994  . COLONOSCOPY  2008   Dr. Jamal Collin  . COLPOPEXY    . COLPORRHAPHY  03/22/2015  . CYSTOURETHROSCOPY    . ESOPHAGOGASTRODUODENOSCOPY (EGD) WITH PROPOFOL N/A 10/04/2016   Procedure:  ESOPHAGOGASTRODUODENOSCOPY (EGD) WITH PROPOFOL;  Surgeon: Manya Silvas, MD;  Location: [facility] ENDOSCOPY;  Service: Endoscopy;  Laterality: N/A;  . sling for stress incontinence      OB History    Gravida  1   Para      Term      Preterm      AB      Living  1     SAB      IAB      Ectopic      Multiple      Live Births           Obstetric Comments  Age with first menstruation-15 Age with first pregnancy-37 LMP-age 66         Home Medications    Prior to Admission medications   Medication Sig Start Date End Date Taking? Authorizing Provider  acetaminophen (TYLENOL) 500 MG tablet Take 500 mg by mouth every 6 (six) hours as needed.   Yes [provider]  albuterol (VENTOLIN HFA) 108 (90 Base) MCG/ACT inhaler Inhale 2 puffs into the lungs every 4 (four) hours as needed for wheezing or shortness of breath. 11/27/20  Yes Margarette Canada, NP  aspirin EC  81 MG tablet Take 81 mg by mouth daily.   Yes [provider]  baclofen (LIORESAL) 10 MG tablet Take 10 mg by mouth 2 (two) times daily.   Yes [provider]  benzonatate (TESSALON) 100 MG capsule Take 2 capsules (200 mg total) by mouth every 8 (eight) hours. 11/27/20  Yes Margarette Canada, NP  carbidopa-levodopa (SINEMET IR) 25-100 MG tablet Take 1 tablet by mouth 3 (three) times daily.   Yes [provider]  cholecalciferol (VITAMIN D) 400 units TABS tablet Take 1,000 Units by mouth daily.   Yes [provider]  estradiol-norethindrone (ACTIVELLA) 1-0.5 MG tablet Take 1 tablet by mouth daily.   Yes [provider]  fesoterodine (TOVIAZ) 4 MG TB24 tablet Take 4 mg by mouth daily.   Yes [provider]  FLUoxetine (PROZAC) 40 MG capsule Take 40 mg by mouth daily.   Yes [provider]  magnesium oxide (MAG-OX) 400 MG tablet Take 400 mg by mouth daily.   Yes [provider]  niacin 250 MG tablet Take 250 mg by mouth 2 (two) times daily.   Yes  [provider]  Omega-3 Fatty Acids (FISH OIL) 1000 MG CAPS Take 1 capsule by mouth 3 (three) times daily.   Yes [provider]  pantoprazole (PROTONIX) 40 MG tablet Take 40 mg by mouth daily.   Yes [provider]  promethazine-dextromethorphan (PROMETHAZINE-DM) 6.25-15 MG/5ML syrup Take 5 mLs by mouth 4 (four) times daily as needed. 11/27/20  Yes Margarette Canada, NP  Red Yeast Rice 600 MG CAPS Take 1 capsule by mouth daily.   Yes [provider]  rizatriptan (MAXALT) 10 MG tablet Take 10 mg by mouth as needed for migraine. May repeat in 2 hours if needed   Yes [provider]  Spacer/Aero-Holding Chambers (AEROCHAMBER MV) inhaler Use as instructed 11/27/20  Yes Margarette Canada, NP  Specialty Vitamins Products ([place] PAK) MISC Take by mouth daily.   Yes [provider]  valACYclovir (VALTREX) 500 MG tablet Take 500 mg by mouth 2 (two) times daily.   Yes [provider]    Family History Family History  Problem Relation Age of Onset  . Coronary artery disease Other        family hx  . Heart disease Brother        also has dissecting abdominal aneuysm at 62  . Heart attack Mother 80  . Stroke Other        family hx  . Breast cancer Sister   . Colon cancer Brother     Social History Social History   Tobacco Use  . Smoking status: Never Smoker  . Smokeless tobacco: Never Used  . Tobacco comment: tobacco use - no  Substance Use Topics  . Alcohol use: No  . Drug use: No     Allergies   Imitrex [sumatriptan], Penicillins, and Sulfonamide derivatives   Review of Systems Review of Systems  Constitutional: Positive for fever. Negative for activity change, appetite change and fatigue.  HENT: Negative for congestion, ear pain, rhinorrhea, sinus pressure, sinus pain and sore throat.   Respiratory: Positive for cough, shortness of breath and wheezing.   Gastrointestinal: Negative for abdominal pain, diarrhea,  nausea and vomiting.  Musculoskeletal: Positive for arthralgias and myalgias.  Skin: Negative for rash.  Neurological: Positive for headaches.  Hematological: Negative.   Psychiatric/Behavioral: Negative.      Physical Exam Triage Vital Signs ED Triage Vitals  Enc Vitals Group  BP 11/27/20 0821 109/71     Pulse Rate 11/27/20 0821 99     Resp 11/27/20 0821 18     Temp 11/27/20 0821 98.9 F (37.2 C)     Temp Source 11/27/20 0821 Oral     SpO2 11/27/20 0821 97 %     Weight 11/27/20 0820 160 lb (72.6 kg)     Height 11/27/20 0820 5\' 2"  (1.575 m)     Head Circumference --      Peak Flow --      Pain Score 11/27/20 0820 0     Pain Loc --      Pain Edu? --      Excl. in [place]? --    No data found.  Updated Vital Signs BP 109/71 (BP Location: Right Arm)   Pulse 99   Temp 98.9 F (37.2 C) (Oral)   Resp 18   Ht 5\' 2"  (1.575 m)   Wt 160 lb (72.6 kg)   SpO2 97%   BMI 29.26 kg/m   Visual Acuity Right Eye Distance:   Left Eye Distance:   Bilateral Distance:    Right Eye Near:   Left Eye Near:    Bilateral Near:     Physical Exam Vitals and nursing note reviewed.  Constitutional:      General: She is not in acute distress.    Appearance: Normal appearance. She is not toxic-appearing.  HENT:     Head: Normocephalic and atraumatic.     Right Ear: Tympanic membrane, ear canal and external ear normal.     Left Ear: Tympanic membrane, ear canal and external ear normal.     Nose: Nose normal. No congestion or rhinorrhea.     Mouth/Throat:     Mouth: Mucous membranes are moist.     Pharynx: Oropharynx is clear. No oropharyngeal exudate or posterior oropharyngeal erythema.  Cardiovascular:     Rate and Rhythm: Normal rate and regular rhythm.     Pulses: Normal pulses.     Heart sounds: No murmur heard. No gallop.   Pulmonary:     Effort: Pulmonary effort is normal.     Breath sounds: Normal breath sounds. No wheezing or rales.  Musculoskeletal:     Cervical back:  Normal range of motion and neck supple. No tenderness.  Lymphadenopathy:     Cervical: No cervical adenopathy.  Skin:    General: Skin is warm and dry.     Capillary Refill: Capillary refill takes less than 2 seconds.     Findings: No erythema.  Neurological:     General: No focal deficit present.     Mental Status: She is alert and oriented to person, place, and time.  Psychiatric:        Mood and Affect: Mood normal.        Behavior: Behavior normal.        Thought Content: Thought content normal.        Judgment: Judgment normal.      UC Treatments / Results  Labs (all labs ordered are listed, but only abnormal results are displayed) Labs Reviewed  RESP PANEL BY RT-PCR (FLU A&B, COVID) [id number]    EKG   Radiology No results found.  Procedures Procedures (including critical care time)  Medications Ordered in UC Medications - No data to display  Initial Impression / Assessment and Plan / UC Course  I have reviewed the triage vital signs and the nursing notes.  Pertinent labs & imaging results that  were available during my care of the patient were reviewed by me and considered in my medical decision making (see chart for details).   Patient is here for evaluation of cough, fever, and body aches have been going on for the past 3 days.  Patient had Covid back in July and she still has residual taste and smell issues.  Patient denies any upper respiratory symptoms or GI symptoms.  Patient has not been vaccinated against Covid or flu.  Physical exam reveals clear lung sounds in all fields.  When patient coughs she does have a wheeze.  Patient is not tachypneic, dyspneic, or in any kind of respiratory distress.  Chest x-ray is warranted at this time. Respiratory panel was collected at triage and is pending.  Respiratory panel is negative for flu and Covid.  We will discharge patient home with a diagnosis of viral URI with cough.  Will give albuterol inhaler with spacer to  help with shortness of breath, Tessalon Perles, and Promethazine DM cough syrup.   Final Clinical Impressions(s) / UC Diagnoses   Final diagnoses:  Viral upper respiratory tract infection     Discharge Instructions     Use the albuterol inhaler, 2 puffs with a spacer every 4-6 hours, as needed for shortness of breath and cough.  Take the [facility] during the day as needed for cough. You can take them every 8 hours. Take them with a small sip of water. They may give you some numbness to the base of your tongue or a metallic taste in her mouth, this is normal.  Use the Promethazine DM cough syrup at bedtime as needed for cough. This will make you drowsy.  If you develop any new or worsening symptoms return for reevaluation or see your primary care provider.    ED Prescriptions    Medication Sig Dispense Auth. Provider   albuterol (VENTOLIN HFA) 108 (90 Base) MCG/ACT inhaler Inhale 2 puffs into the lungs every 4 (four) hours as needed for wheezing or shortness of breath. 18 g Becky Augusta, NP   Spacer/Aero-Holding Chambers (AEROCHAMBER MV) inhaler Use as instructed 1 each Becky Augusta, NP   benzonatate (TESSALON) 100 MG capsule Take 2 capsules (200 mg total) by mouth every 8 (eight) hours. 21 capsule Becky Augusta, NP   promethazine-dextromethorphan (PROMETHAZINE-DM) 6.25-15 MG/5ML syrup Take 5 mLs by mouth 4 (four) times daily as needed. 118 mL Becky Augusta, NP     PDMP not reviewed this encounter.   Becky Augusta, NP 11/27/20 [phone]

## 2021-05-06 ENCOUNTER — Other Ambulatory Visit: Payer: Self-pay

## 2021-05-06 ENCOUNTER — Encounter: Payer: Self-pay | Admitting: *Deleted

## 2021-05-06 ENCOUNTER — Ambulatory Visit: Payer: Medicare Other | Admitting: Anesthesiology

## 2021-05-06 ENCOUNTER — Ambulatory Visit
Admission: RE | Admit: 2021-05-06 | Discharge: 2021-05-06 | Disposition: A | Discharge status: Home / Self Care | Payer: Medicare Other | Attending: Gastroenterology | Admitting: Gastroenterology

## 2021-05-06 ENCOUNTER — Encounter
Admission: RE | Disposition: A | Discharge status: Home / Self Care | Payer: Self-pay | Source: Home / Self Care | Attending: Gastroenterology

## 2021-05-06 DIAGNOSIS — Z888 Allergy status to other drugs, medicaments and biological substances status: Secondary | ICD-10-CM | POA: Insufficient documentation

## 2021-05-06 DIAGNOSIS — Z880 Allergy status to penicillin: Secondary | ICD-10-CM | POA: Diagnosis not present

## 2021-05-06 DIAGNOSIS — Z1211 Encounter for screening for malignant neoplasm of colon: Secondary | ICD-10-CM | POA: Insufficient documentation

## 2021-05-06 DIAGNOSIS — K640 First degree hemorrhoids: Secondary | ICD-10-CM | POA: Insufficient documentation

## 2021-05-06 DIAGNOSIS — Z791 Long term (current) use of non-steroidal anti-inflammatories (NSAID): Secondary | ICD-10-CM | POA: Insufficient documentation

## 2021-05-06 DIAGNOSIS — R1310 Dysphagia, unspecified: Secondary | ICD-10-CM | POA: Diagnosis present

## 2021-05-06 DIAGNOSIS — Z7982 Long term (current) use of aspirin: Secondary | ICD-10-CM | POA: Insufficient documentation

## 2021-05-06 DIAGNOSIS — Z800 Family history of malignant neoplasm of digestive organs: Secondary | ICD-10-CM | POA: Insufficient documentation

## 2021-05-06 DIAGNOSIS — K219 Gastro-esophageal reflux disease without esophagitis: Secondary | ICD-10-CM | POA: Insufficient documentation

## 2021-05-06 DIAGNOSIS — D122 Benign neoplasm of ascending colon: Secondary | ICD-10-CM | POA: Diagnosis not present

## 2021-05-06 DIAGNOSIS — Z79899 Other long term (current) drug therapy: Secondary | ICD-10-CM | POA: Diagnosis not present

## 2021-05-06 DIAGNOSIS — Z882 Allergy status to sulfonamides status: Secondary | ICD-10-CM | POA: Diagnosis not present

## 2021-05-06 HISTORY — PX: COLONOSCOPY WITH PROPOFOL: SHX5780

## 2021-05-06 HISTORY — DX: Other amnesia: R41.3

## 2021-05-06 HISTORY — DX: Uterovaginal prolapse, unspecified: N81.4

## 2021-05-06 HISTORY — DX: Other symptoms and signs involving the nervous system: R29.818

## 2021-05-06 HISTORY — PX: ESOPHAGOGASTRODUODENOSCOPY: SHX5428

## 2021-05-06 HISTORY — DX: Unspecified abnormal involuntary movements: R25.9

## 2021-05-06 HISTORY — DX: Other chest pain: R07.89

## 2021-05-06 HISTORY — DX: Insomnia, unspecified: G47.00

## 2021-05-06 SURGERY — EGD (ESOPHAGOGASTRODUODENOSCOPY)
Anesthesia: General

## 2021-05-06 MED ORDER — PROPOFOL 500 MG/50ML IV EMUL
INTRAVENOUS | Status: AC
  Filled 2021-05-06: qty 50

## 2021-05-06 MED ORDER — FENTANYL CITRATE (PF) 100 MCG/2ML IJ SOLN
INTRAMUSCULAR | Status: AC
  Filled 2021-05-06: qty 2

## 2021-05-06 MED ORDER — FENTANYL CITRATE (PF) 100 MCG/2ML IJ SOLN
INTRAMUSCULAR | Status: DC | PRN
  Administered 2021-05-06 (×2): 50 ug via INTRAVENOUS

## 2021-05-06 MED ORDER — MIDAZOLAM HCL 2 MG/2ML IJ SOLN
INTRAMUSCULAR | Status: DC | PRN
  Administered 2021-05-06: 2 mg via INTRAVENOUS

## 2021-05-06 MED ORDER — LIDOCAINE HCL (CARDIAC) PF 100 MG/5ML IV SOSY
PREFILLED_SYRINGE | INTRAVENOUS | Status: DC | PRN
  Administered 2021-05-06: 50 mg via INTRAVENOUS

## 2021-05-06 MED ORDER — SODIUM CHLORIDE 0.9 % IV SOLN: INTRAVENOUS | Status: DC

## 2021-05-06 MED ORDER — MIDAZOLAM HCL 2 MG/2ML IJ SOLN
INTRAMUSCULAR | Status: AC
  Filled 2021-05-06: qty 2

## 2021-05-06 MED ORDER — PROPOFOL 500 MG/50ML IV EMUL
INTRAVENOUS | Status: DC | PRN
  Administered 2021-05-06: 50 ug/kg/min via INTRAVENOUS

## 2021-05-06 MED ORDER — PROPOFOL 10 MG/ML IV BOLUS
INTRAVENOUS | Status: DC | PRN
  Administered 2021-05-06 (×2): 20 mg via INTRAVENOUS
  Administered 2021-05-06: 10 mg via INTRAVENOUS

## 2021-05-06 NOTE — Interval H&P Note (Signed)
History and Physical Interval Note:  05/06/2021 10:08 AM  Margaret Roberson  has presented today for surgery, with the diagnosis of GERD,FAMILY HX.OF COLON CANCER.  The various methods of treatment have been discussed with the patient and family. After consideration of risks, benefits and other options for treatment, the patient has consented to  Procedure(s): ESOPHAGOGASTRODUODENOSCOPY (EGD) (N/A) COLONOSCOPY WITH PROPOFOL (N/A) as a surgical intervention.  The patient's history has been reviewed, patient examined, no change in status, stable for surgery.  I have reviewed the patient's chart and labs.  Questions were answered to the patient's satisfaction.     Lesly Rubenstein  Ok to proceed with EGD/Colonoscopy

## 2021-05-06 NOTE — Op Note (Signed)
[facility] Gastroenterology Patient Name: Margaret Roberson Procedure Date: 05/06/2021 10:07 AM MRN: [id number] Account #: [id number] Date of Birth: 09/02/1956 Admit Type: Outpatient Age: 65 Room: [facility] ENDO ROOM 3 Gender: Female Note Status: Finalized Procedure:             Colonoscopy Indications:           Screening in patient at increased risk: Family history                         of 1st-degree relative with colorectal cancer before                         age 64 years Providers:             Andrey Farmer MD, MD Referring MD:          Sofie Hartigan (Referring MD) Medicines:             Monitored Anesthesia Care Complications:         No immediate complications. Estimated blood loss:                         Minimal. Procedure:             Pre-Anesthesia Assessment:                        - Prior to the procedure, a History and Physical was                         performed, and patient medications and allergies were                         reviewed. The patient is competent. The risks and                         benefits of the procedure and the sedation options and                         risks were discussed with the patient. All questions                         were answered and informed consent was obtained.                         Patient identification and proposed procedure were                         verified by the physician, the nurse, the anesthetist                         and the technician in the endoscopy suite. Mental                         Status Examination: alert and oriented. Airway                         Examination: normal oropharyngeal airway and neck  mobility. Respiratory Examination: clear to                         auscultation. CV Examination: normal. Prophylactic                         Antibiotics: The patient does not require prophylactic                         antibiotics. Prior Anticoagulants:  The patient has                         taken no previous anticoagulant or antiplatelet                         agents. ASA Grade Assessment: III - A patient with                         severe systemic disease. After reviewing the risks and                         benefits, the patient was deemed in satisfactory                         condition to undergo the procedure. The anesthesia                         plan was to use monitored anesthesia care (MAC).                         Immediately prior to administration of medications,                         the patient was re-assessed for adequacy to receive                         sedatives. The heart rate, respiratory rate, oxygen                         saturations, blood pressure, adequacy of pulmonary                         ventilation, and response to care were monitored                         throughout the procedure. The physical status of the                         patient was re-assessed after the procedure.                        After obtaining informed consent, the colonoscope was                         passed under direct vision. Throughout the procedure,                         the patient's blood pressure, pulse, and oxygen  saturations were monitored continuously. The                         Colonoscope was introduced through the anus and                         advanced to the the cecum, identified by appendiceal                         orifice and ileocecal valve. The colonoscopy was                         performed without difficulty. The patient tolerated                         the procedure well. The quality of the bowel                         preparation was good. Findings:      The perianal and digital rectal examinations were normal.      A less than 1 mm polyp was found in the ascending colon. The polyp was       sessile. The polyp was removed with a jumbo cold forceps. Resection and        retrieval were complete. Estimated blood loss was minimal.      Internal hemorrhoids were found during retroflexion. The hemorrhoids       were Grade I (internal hemorrhoids that do not prolapse).      The exam was otherwise without abnormality on direct and retroflexion       views. Impression:            - One less than 1 mm polyp in the ascending colon,                         removed with a jumbo cold forceps. Resected and                         retrieved.                        - Internal hemorrhoids.                        - The examination was otherwise normal on direct and                         retroflexion views. Recommendation:        - Discharge patient to home.                        - Resume previous diet.                        - Continue present medications.                        - Await pathology results.                        - Repeat colonoscopy in 5 years for surveillance.                        -  Return to referring physician as previously                         scheduled. Procedure Code(s):     --- Professional ---                        [phone], Colonoscopy, flexible; with biopsy, single or                         multiple Diagnosis Code(s):     --- Professional ---                        Z80.0, Family history of malignant neoplasm of                         digestive organs                        K63.5, Polyp of colon                        K64.0, First degree hemorrhoids CPT copyright 2019 American Medical Association. All rights reserved. The codes documented in this report are preliminary and upon coder review may  be revised to meet current compliance requirements. Andrey Farmer MD, MD 05/06/2021 10:47:19 AM Number of Addenda: 0 Note Initiated On: 05/06/2021 10:07 AM Scope Withdrawal Time: 0 hours 11 minutes 4 seconds  Total Procedure Duration: 0 hours 18 minutes 15 seconds  Estimated Blood Loss:  Estimated blood loss was minimal.       [facility]

## 2021-05-06 NOTE — Transfer of Care (Signed)
Immediate Anesthesia Transfer of Care Note  Patient: Margaret Roberson  Procedure(s) Performed: ESOPHAGOGASTRODUODENOSCOPY (EGD) COLONOSCOPY WITH PROPOFOL  Patient Location: PACU  Anesthesia Type:General  Level of Consciousness: sedated  Airway & Oxygen Therapy: Patient Spontanous Breathing and Patient connected to nasal cannula oxygen  Post-op Assessment: Report given to RN and Post -op Vital signs reviewed and stable  Post vital signs: Reviewed and stable  Last Vitals:  Vitals Value Taken Time  BP 117/66 05/06/21 1044  Temp    Pulse 86 05/06/21 1044  Resp 16 05/06/21 1044  SpO2 94 % 05/06/21 1044  Vitals shown include unvalidated device data.  Last Pain:  Vitals:   05/06/21 0946  TempSrc: Temporal  PainSc: 0-No pain         Complications: No notable events documented.

## 2021-05-06 NOTE — H&P (Signed)
Outpatient short stay form Pre-procedure 05/06/2021 10:04 AM Raylene Miyamoto MD, MPH  Primary Physician: Dr. Ellison Hughs  Reason for visit:  Dysphagia/Family history of oclon cancer  History of present illness:   65 y/o lady with undiagnosed neurological disorder here for EGD due to dysphagia to solids and liquids. This is chronic and waxes and wanes. Has been on baclofen for 5 years. Brother with colon cancer < 76. Last colonoscopy was 2015 and was normal. No blood thinners. History of hysterectomy.    Current Facility-Administered Medications:    0.9 %  sodium chloride infusion, , Intravenous, Continuous, Fanchon Papania, Hilton Cork, MD  Medications Prior to Admission  Medication Sig Dispense Refill Last Dose   acetaminophen (TYLENOL) 500 MG tablet Take 500 mg by mouth every 6 (six) hours as needed.   05/04/2021 at 2030   albuterol (2.5 MG/3ML) 0.083% NEBU 3 mL, albuterol (5 MG/ML) 0.5% NEBU 0.5 mL Inhale into the lungs.   Past Week   albuterol (VENTOLIN HFA) 108 (90 Base) MCG/ACT inhaler Inhale 2 puffs into the lungs every 4 (four) hours as needed for wheezing or shortness of breath. 18 g 0 Past Week   aspirin EC 81 MG tablet Take 81 mg by mouth daily.   Past Month   baclofen (LIORESAL) 10 MG tablet Take 10 mg by mouth 2 (two) times daily.   05/05/2021 at 1000   benzonatate (TESSALON) 100 MG capsule Take 2 capsules (200 mg total) by mouth every 8 (eight) hours. 21 capsule 0 Past Month   cholecalciferol (VITAMIN D) 400 units TABS tablet Take 1,000 Units by mouth daily.   Past Week   ipratropium-albuterol (DUONEB) 0.5-2.5 (3) MG/3ML SOLN Take 3 mLs by nebulization.   Past Week   magnesium oxide (MAG-OX) 400 MG tablet Take 400 mg by mouth daily.   Past Month   naproxen (NAPROSYN) 500 MG tablet Take 500 mg by mouth 2 (two) times daily with a meal.   Past Week   Omega-3 Fatty Acids (FISH OIL) 1000 MG CAPS Take 1 capsule by mouth 3 (three) times daily.   Past Month   ondansetron (ZOFRAN) 4 MG tablet Take  4 mg by mouth every 8 (eight) hours as needed for nausea or vomiting.   Past Month   pantoprazole (PROTONIX) 40 MG tablet Take 40 mg by mouth daily.   05/05/2021 at 1000   Probiotic Product (PROBIOTIC PO) Take by mouth.   Past Week   promethazine-dextromethorphan (PROMETHAZINE-DM) 6.25-15 MG/5ML syrup Take 5 mLs by mouth 4 (four) times daily as needed. 118 mL 0 Past Month   rizatriptan (MAXALT) 10 MG tablet Take 10 mg by mouth as needed for migraine. May repeat in 2 hours if needed   Past Month   rosuvastatin (CRESTOR) 5 MG tablet Take 5 mg by mouth daily.   05/05/2021 at 1000   sertraline (ZOLOFT) 100 MG tablet Take 100 mg by mouth daily.   05/05/2021 at 1000   TURMERIC PO Take by mouth.   Past Month   valACYclovir (VALTREX) 500 MG tablet Take 500 mg by mouth 2 (two) times daily.   Past Month   vitamin B-12 (CYANOCOBALAMIN) 1000 MCG tablet Take 1,000 mcg by mouth daily.   Past Week   carbidopa-levodopa (SINEMET IR) 25-100 MG tablet Take 1 tablet by mouth 3 (three) times daily. (Patient not taking: Reported on 05/06/2021)   Not Taking   estradiol-norethindrone (ACTIVELLA) 1-0.5 MG tablet Take 1 tablet by mouth daily. (Patient not taking: Reported on 05/06/2021)  Not Taking   fesoterodine (TOVIAZ) 4 MG TB24 tablet Take 4 mg by mouth daily. (Patient not taking: Reported on 05/06/2021)   Not Taking   FLUoxetine (PROZAC) 40 MG capsule Take 40 mg by mouth daily. (Patient not taking: Reported on 05/06/2021)   Not Taking   niacin 250 MG tablet Take 250 mg by mouth 2 (two) times daily. (Patient not taking: Reported on 05/06/2021)   Not Taking   Red Yeast Rice 600 MG CAPS Take 1 capsule by mouth daily. (Patient not taking: Reported on 05/06/2021)   Not Taking   Spacer/Aero-Holding Chambers (AEROCHAMBER MV) inhaler Use as instructed 1 each 2    Specialty Vitamins Products ([place] PAK) MISC Take by mouth daily. (Patient not taking: Reported on 05/06/2021)   Not Taking     Allergies  Allergen Reactions    Imitrex [Sumatriptan]    Penicillins    Sulfonamide Derivatives      Past Medical History:  Diagnosis Date   Anxiety    Atypical chest pain    Bowel trouble 1998   Cervicalgia 03/09/2014   Depression    Diffuse cystic mastopathy    Family history of malignant neoplasm of breast    Family history of malignant neoplasm of gastrointestinal tract    Fibrocystic breast changes    Gout 2004   pseudogout   Headache disorder    R sided, etiology unknown    HLD (hyperlipidemia)    Insomnia    Loss of memory    Lump or mass in breast 2012   Osteopenia    Parkinsonian features    Pseudogout    Sleep apnea    Urinary incontinence    Uterine prolapse    Vitamin D deficiency     Review of systems:  Otherwise negative.    Physical Exam  Gen: Alert, oriented. Appears stated age.  HEENT: PERRLA. Lungs: No respiratory distress CV: RRR Abd: soft, benign, no masses Ext: No edema    Planned procedures: Proceed with EGD/colonoscopy. The patient understands the nature of the planned procedure, indications, risks, alternatives and potential complications including but not limited to bleeding, infection, perforation, damage to internal organs and possible oversedation/side effects from anesthesia. The patient agrees and gives consent to proceed.  Please refer to procedure notes for findings, recommendations and patient disposition/instructions.     Raylene Miyamoto MD, MPH Gastroenterology 05/06/2021  10:04 AM

## 2021-05-06 NOTE — Anesthesia Preprocedure Evaluation (Signed)
Anesthesia Evaluation  Patient identified by MRN, date of birth, ID band Patient awake    Reviewed: Allergy & Precautions, NPO status , Patient's Chart, lab work & pertinent test results  History of Anesthesia Complications (+) Family history of anesthesia reaction and history of anesthetic complications (cousin with ?malignant hyperthermia)  Airway Mallampati: II       Dental   Pulmonary sleep apnea (not officially diagnosed) , Not current smoker,           Cardiovascular (-) hypertension(-) Past MI and (-) CHF (-) dysrhythmias (-) Valvular Problems/Murmurs     Neuro/Psych neg Seizures Anxiety Depression Pt with muscle contractions, considered for Parkinson's, but ruled out.    GI/Hepatic Neg liver ROS, neg GERD  ,  Endo/Other  neg diabetes  Renal/GU negative Renal ROS     Musculoskeletal   Abdominal   Peds  Hematology   Anesthesia Other Findings   Reproductive/Obstetrics                             Anesthesia Physical Anesthesia Plan  ASA: 3  Anesthesia Plan: General   Post-op Pain Management:    Induction: Intravenous  PONV Risk Score and Plan: 3 and Propofol infusion, TIVA and Treatment may vary due to age or medical condition  Airway Management Planned: Nasal Cannula  Additional Equipment:   Intra-op Plan:   Post-operative Plan:   Informed Consent: I have reviewed the patients History and Physical, chart, labs and discussed the procedure including the risks, benefits and alternatives for the proposed anesthesia with the patient or authorized representative who has indicated his/her understanding and acceptance.       Plan Discussed with:   Anesthesia Plan Comments:         Anesthesia Quick Evaluation

## 2021-05-06 NOTE — Op Note (Signed)
[facility] Patient Name: Margaret Roberson Procedure Date: 05/06/2021 10:08 AM MRN: [id number] Account #: [id number] Date of Birth: 1956/05/30 Admit Type: Outpatient Age: 65 Room: [facility] ENDO ROOM 3 Gender: Female Note Status: Finalized Procedure:             Upper GI endoscopy Indications:           Dysphagia, Gastro-esophageal reflux disease Providers:             Andrey Farmer MD, MD Referring MD:          Sofie Hartigan (Referring MD) Medicines:             Monitored Anesthesia Care Complications:         No immediate complications. Estimated blood loss:                         Minimal. Procedure:             Pre-Anesthesia Assessment:                        - Prior to the procedure, a History and Physical was                         performed, and patient medications and allergies were                         reviewed. The patient is competent. The risks and                         benefits of the procedure and the sedation options and                         risks were discussed with the patient. All questions                         were answered and informed consent was obtained.                         Patient identification and proposed procedure were                         verified by the physician, the nurse, the anesthetist                         and the technician in the endoscopy suite. Mental                         Status Examination: alert and oriented. Airway                         Examination: normal oropharyngeal airway and neck                         mobility. Respiratory Examination: clear to                         auscultation. CV Examination: normal. Prophylactic  Antibiotics: The patient does not require prophylactic                         antibiotics. Prior Anticoagulants: The patient has                         taken no previous anticoagulant or antiplatelet                         agents.  ASA Grade Assessment: III - A patient with                         severe systemic disease. After reviewing the risks and                         benefits, the patient was deemed in satisfactory                         condition to undergo the procedure. The anesthesia                         plan was to use monitored anesthesia care (MAC).                         Immediately prior to administration of medications,                         the patient was re-assessed for adequacy to receive                         sedatives. The heart rate, respiratory rate, oxygen                         saturations, blood pressure, adequacy of pulmonary                         ventilation, and response to care were monitored                         throughout the procedure. The physical status of the                         patient was re-assessed after the procedure.                        After obtaining informed consent, the endoscope was                         passed under direct vision. Throughout the procedure,                         the patient's blood pressure, pulse, and oxygen                         saturations were monitored continuously. The Endoscope                         was introduced through the mouth, and advanced to the  second part of duodenum. The upper GI endoscopy was                         accomplished without difficulty. The patient tolerated                         the procedure well. Findings:      The examined esophagus was normal. Biopsies were obtained from the       proximal and distal esophagus with cold forceps for histology of       suspected eosinophilic esophagitis. Estimated blood loss was minimal.      The entire examined stomach was normal.      The examined duodenum was normal. Impression:            - Normal esophagus. Biopsied.                        - Normal stomach.                        - Normal examined duodenum. Recommendation:         - Perform a colonoscopy today. Procedure Code(s):     --- Professional ---                        [phone], Esophagogastroduodenoscopy, flexible,                         transoral; with biopsy, single or multiple Diagnosis Code(s):     --- Professional ---                        R13.10, Dysphagia, unspecified                        K21.9, Gastro-esophageal reflux disease without                         esophagitis CPT copyright 2019 American Medical Association. All rights reserved. The codes documented in this report are preliminary and upon coder review may  be revised to meet current compliance requirements. Andrey Farmer MD, MD 05/06/2021 10:44:44 AM Number of Addenda: 0 Note Initiated On: 05/06/2021 10:08 AM Estimated Blood Loss:  Estimated blood loss was minimal.      [facility]

## 2021-05-06 NOTE — Anesthesia Postprocedure Evaluation (Signed)
Anesthesia Post Note  Patient: Margaret Roberson  Procedure(s) Performed: ESOPHAGOGASTRODUODENOSCOPY (EGD) COLONOSCOPY WITH PROPOFOL  Patient location during evaluation: Endoscopy Anesthesia Type: General Level of consciousness: awake and alert Pain management: pain level controlled Vital Signs Assessment: post-procedure vital signs reviewed and stable Respiratory status: spontaneous breathing, nonlabored ventilation, respiratory function stable and patient connected to nasal cannula oxygen Cardiovascular status: blood pressure returned to baseline and stable Postop Assessment: no apparent nausea or vomiting Anesthetic complications: no   No notable events documented.   Last Vitals:  Vitals:   05/06/21 1053 05/06/21 1114  BP:  111/74  Pulse:    Resp:    Temp: (!) 36.1 C   SpO2:      Last Pain:  Vitals:   05/06/21 1124  TempSrc:   PainSc: 0-No pain                 Arita Miss

## 2021-05-10 LAB — SURGICAL PATHOLOGY

## 2021-08-10 ENCOUNTER — Other Ambulatory Visit: Payer: Self-pay | Admitting: Orthopedic Surgery

## 2021-08-10 DIAGNOSIS — M5136 Other intervertebral disc degeneration, lumbar region: Secondary | ICD-10-CM

## 2021-08-10 DIAGNOSIS — G8929 Other chronic pain: Secondary | ICD-10-CM

## 2021-08-20 ENCOUNTER — Ambulatory Visit
Admission: RE | Admit: 2021-08-20 | Discharge: 2021-08-20 | Disposition: A | Discharge status: Home / Self Care | Payer: Medicare Other | Source: Ambulatory Visit | Attending: Orthopedic Surgery | Admitting: Orthopedic Surgery

## 2021-08-20 ENCOUNTER — Other Ambulatory Visit: Payer: Self-pay

## 2021-08-20 DIAGNOSIS — M5441 Lumbago with sciatica, right side: Secondary | ICD-10-CM | POA: Insufficient documentation

## 2021-08-20 DIAGNOSIS — M5136 Other intervertebral disc degeneration, lumbar region: Secondary | ICD-10-CM | POA: Diagnosis present

## 2021-08-20 DIAGNOSIS — G8929 Other chronic pain: Secondary | ICD-10-CM

## 2021-08-20 DIAGNOSIS — M5442 Lumbago with sciatica, left side: Secondary | ICD-10-CM | POA: Insufficient documentation

## 2021-11-01 ENCOUNTER — Ambulatory Visit: Payer: Self-pay | Admitting: General Surgery

## 2021-11-01 NOTE — H&P (Signed)
PATIENT PROFILE: Margaret Roberson is a 65 y.o. female who presents to the Clinic for consultation at the request of Dr. Ellison Hughs for evaluation of cholelithiasis.  PCP:  Loa Socks., MD  HISTORY OF PRESENT ILLNESS: [name] reports she has been having right upper quadrant pain that radiates to her right back and right shoulder blade.  Pain has been there for 3 to 6 months.  Pain is is exacerbated by eating.  She also endorses associated diarrhea, bloating and loss of appetite.  She denies any alleviating factors.  She has been dealing with back pain and knee pain for a long time.  She takes naproxen and acetaminophen for her pains.  Denies any fever or chills.  Patient had an MRI of the lumbar spine for her back pain evaluation.  Cholelithiasis was incidentally found on that study.  I personally evaluated the MRI of the lumbar spine and identified the gallbladder stone.  Her labs shows normal AST/ALT, alkaline phosphatase and bilirubin.   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   Mixed urge and stress incontinence 11/28/2013   Uterovaginal prolapse 11/28/2013    GENERAL REVIEW OF SYSTEMS:   General ROS: negative for - chills, fatigue, fever, weight gain or weight loss Allergy and Immunology ROS: negative for - hives  Hematological and Lymphatic ROS: negative for - bleeding problems or bruising, negative for palpable nodes Endocrine ROS: negative for -  heat or cold intolerance, hair changes Respiratory ROS: negative for - cough, shortness of breath or wheezing Cardiovascular ROS: no chest pain or palpitations GI ROS: Positive for nausea, vomiting, abdominal pain, diarrhea, constipation Musculoskeletal ROS: negative for - joint swelling or muscle pain Neurological ROS: negative for - confusion, syncope Dermatological ROS: negative for pruritus and rash Psychiatric: negative for anxiety, depression, difficulty sleeping and memory loss  MEDICATIONS: Current Outpatient Medications  Medication Sig Dispense Refill   acetaminophen (TYLENOL) 500 MG tablet Take by mouth.     albuterol (PROVENTIL) 2.5 mg /3 mL (0.083 %) nebulizer solution Take 3 mLs (2.5 mg total) by nebulization every 4 (four) hours as needed for Wheezing or Shortness of Breath for up to 7 days 300 mL 0   albuterol 90 mcg/actuation inhaler Inhale 2 inhalations into the lungs every 4 (four) hours as needed for Wheezing or Shortness of Breath 1 each 1   baclofen (LIORESAL) 10 MG tablet TAKE ONE TABLET BY MOUTH TWICE DAILY 60 tablet 2   cholecalciferol (VITAMIN D3) 1,000 unit tablet Take 2,000 Units by mouth once daily.       cyanocobalamin (VITAMIN B12) 1000 MCG tablet Take 1,000 mcg by mouth once daily     gabapentin (NEURONTIN) 100 MG capsule Take 1 capsule (100 mg total) by mouth at bedtime for 90 days 90 capsule 0   ipratropium-albuteroL (DUO-NEB) nebulizer solution USE 1 VIAL VIA NEBULIZER FOUR TIMES DAILY FOR 7 DAYS 60 mL 0   magnesium oxide (MAG-OX) 400 mg tablet Take  400 mg by mouth once daily.     naproxen (NAPROSYN) 500 MG tablet TAKE 1 TABLET BY MOUTH TWICE A DAY AS NEEDED 60 tablet 5   pantoprazole (PROTONIX) 40 MG DR tablet TAKE ONE TABLET BY MOUTH TWO TIMES A DAY 180 tablet 3   rosuvastatin (CRESTOR) 5 MG tablet TAKE 1 TABLET(5 MG) BY MOUTH EVERY DAY 90 tablet 1   sertraline (ZOLOFT) 100 MG tablet Take 200 mg by mouth once daily        tiZANidine (ZANAFLEX) 4 MG tablet  TAKE 1 TABLET (4 MG TOTAL) BY MOUTH AT BEDTIME AS NEEDED 30 tablet 1   traZODone (DESYREL) 50 MG tablet TAKE ONE-HALF TO ONE TABLET BY MOUTH EVERY NIGHT AT BEDTIME     valACYclovir (VALTREX) 500 MG tablet TAKE 1 TABLET BY MOUTH TWICE A DAY (Patient taking differently: Take 1 tablet (500 mg total) by mouth 2 (two) times daily as needed) 6 tablet 6   vitamin B complex (B COMPLEX ORAL) Take by mouth     vitamin E acetate (VITAMIN E ORAL) Take by mouth.     gabapentin (NEURONTIN) 300 MG capsule Take 1 capsule (300 mg total) by mouth at bedtime for 30 days 30 capsule 5   POTASSIUM ORAL Take 99 mg by mouth. (Patient not taking: Reported on 11/01/2021)     No current facility-administered medications for this visit.    ALLERGIES: Imitrex [sumatriptan succinate], Penicillins, Sulfa (sulfonamide antibiotics), and Sulfasalazine  PAST MEDICAL HISTORY: Past Medical History:  Diagnosis Date   Abnormal menstrual cycle 03/09/2014   Cervicalgia 03/09/2014   Fibrocystic breast changes    Headache 03/09/2014   Hyperlipidemia    Migraine headache 03/09/2014   OSA (obstructive sleep apnea) 03/09/2014   Osteopenia    Tremor    Vitamin D deficiency 03/09/2014    PAST SURGICAL HISTORY: Past Surgical History:  Procedure Laterality Date   CESAREAN SECTION     COLONOSCOPY  01/08/2014 [facility])   [facility] (Brother): CBF 12/2018 Recall ltr mailed    COLONOSCOPY  05/06/2021   Tubular adenoma/FHx CC/Repeat 60yrs/CTL   COLPOPEXY FOR SUSPENSION UTEROSACRUM INTRAPERITONEAL N/A 03/22/2015   Procedure: COLPOPEXY FOR SUSPENSION UTEROSACRUM INTRAPERITONEAL;  Surgeon: Colletta Maryland, MD;  Location: [facility] OR;  Service: Gynecology;  Laterality: N/A;   COLPORRHAPHY FOR REPAIR CYSTOCELE ANTERIOR  03/22/2015   Procedure: COLPORRHAPHY FOR REPAIR CYSTOCELE ANTERIOR;  Surgeon: Colletta Maryland, MD;  Location: [facility] OR;  Service: Gynecology;;   CYSTOURETHROSCOPY N/A 03/22/2015   Procedure: CYSTOURETHROSCOPY;  Surgeon: Colletta Maryland, MD;  Location: [facility] OR;  Service: Gynecology;  Laterality: N/A;   EGD  10/04/2016   Gastritis: No repeat per RTE   EGD  05/06/2021   Normal EGD biopsies/Repeat as needed/CTL   HYSTERECTOMY VAGINAL N/A 03/22/2015   Procedure: (RCC) HYSTERECTOMY VAGINAL;  Surgeon: Colletta Maryland, MD;  Location: [facility] OR;  Service: Gynecology;  Laterality: N/A;   SLING FOR STRESS INCONTINENCE N/A 03/22/2015   Procedure: Lindley Magnus FOR STRESS INCONTINENCE;  Surgeon: Colletta Maryland, MD;  Location: [facility] OR;  Service: Gynecology;  Laterality: N/A;     FAMILY HISTORY: Family History  Problem Relation Age of Onset   Myocardial Infarction (Heart attack) Mother    High blood pressure (Hypertension) Mother    Stroke Mother    Breast cancer Sister    Heart disease Brother    Colon cancer Brother    High blood pressure (Hypertension) Brother    Other Brother  dissecting abdominal aneurysm   Anesthesia problems Cousin 19       died in surgery for pin in elbow 1960's,no other details,possibly MH     SOCIAL HISTORY: Social History   Socioeconomic History   Marital status: Married  Tobacco Use   Smoking status: Never   Smokeless tobacco: Never  Vaping Use   Vaping Use: Never used  Substance and Sexual Activity   Alcohol use: No   Drug use: No   Sexual activity: Never    PHYSICAL EXAM: Vitals:   11/01/21 1321  BP: 122/73  Pulse: 82   Body mass index is 29.08 kg/m. Weight: 72.1 kg (159 lb)   GENERAL: Alert, active, oriented x3  HEENT: Pupils equal reactive to light. Extraocular movements are intact. Sclera clear. Palpebral conjunctiva normal red color.Pharynx clear.  NECK: Supple with no palpable mass and no adenopathy.  LUNGS: Sound clear with no rales rhonchi or wheezes.  HEART: Regular rhythm S1 and S2 without murmur.  ABDOMEN: Soft and depressible, nontender with no palpable mass, no hepatomegaly.   EXTREMITIES: Well-developed well-nourished symmetrical  with no dependent edema.  NEUROLOGICAL: Awake alert oriented, facial expression symmetrical, moving all extremities.  REVIEW OF DATA: I have reviewed the following data today: Initial consult on 11/01/2021  Component Date Value   WBC (White Blood Cell Co* 11/01/2021 6.8    RBC (Red Blood Cell Coun* 11/01/2021 3.75 (L)    Hemoglobin 11/01/2021 12.2    Hematocrit 11/01/2021 37.5    MCV (Mean Corpuscular Vo* 11/01/2021 100.0    MCH (Mean Corpuscular He* 11/01/2021 32.5 (H)    MCHC (Mean Corpuscular H* 11/01/2021 32.5    Platelet Count 11/01/2021 259    RDW-CV (Red Cell Distrib* 11/01/2021 14.0    MPV (Mean Platelet Volum* 11/01/2021 9.6    Neutrophils 11/01/2021 3.95    Lymphocytes 11/01/2021 2.04    Monocytes 11/01/2021 0.65    Eosinophils 11/01/2021 0.07    Basophils 11/01/2021 0.05    Neutrophil % 11/01/2021 58.5    Lymphocyte % 11/01/2021 30.1    Monocyte % 11/01/2021 9.6    Eosinophil % 11/01/2021 1.0    Basophil% 11/01/2021 0.7    Immature Granulocyte % 11/01/2021 0.1    Immature Granulocyte Cou* 11/01/2021 0.01    Glucose 11/01/2021 94    Sodium 11/01/2021 140    Potassium 11/01/2021 4.4    Chloride 11/01/2021 102    Carbon Dioxide (CO2) 11/01/2021 30.4    Urea Nitrogen (BUN) 11/01/2021 14    Creatinine 11/01/2021 0.8    Glomerular Filtration Ra* 11/01/2021 72    Calcium 11/01/2021 10.1    AST  11/01/2021 12    ALT  11/01/2021 10    Alk Phos (alkaline Phosp* 11/01/2021 82    Albumin 11/01/2021 4.7    Bilirubin, Total 11/01/2021 0.5    Protein, Total 11/01/2021 7.1    A/G Ratio 11/01/2021 2.0      ASSESSMENT: Ms. Rease is a 65 y.o. female presenting for consultation for cholelithiasis.    Patient was oriented about the diagnosis of cholelithiasis. Also oriented about what is the gallbladder, its anatomy and function and the implications of having stones. The patient was oriented about the treatment alternatives (observation vs cholecystectomy). Patient was  oriented that a low percentage of patient will continue to have similar pain symptoms even after the gallbladder is removed. Surgical technique (open vs laparoscopic) was discussed. It was also discussed the goals of the surgery (decrease the pain episodes and avoid the  risk of cholecystitis) and the risk of surgery including: bleeding, infection, common bile duct injury, stone retention, injury to other organs such as bowel, liver, stomach, other complications such as hernia, bowel obstruction among others. Also discussed with patient about anesthesia and its complications such as: reaction to medications, pneumonia, heart complications, death, among others.   I discussed with the patient that the bloating and diarrhea might not necessarily be related to the gallstones but they may also improve as well.  Cholelithiasis without cholecystitis [K80.20]  PLAN: 1.  Robotic assisted laparoscopic cholecystectomy ([id number]) 2.  CBC, CMP 3.  Do not take aspirin 5 days before the procedure 4.  Contact us if has any question or concern.   Patient verbalized understanding, all questions were answered, and were agreeable with the plan outlined above.     Herbert Pun, MD  Electronically signed by Herbert Pun, MD

## 2021-11-01 NOTE — H&P (View-Only) (Signed)
PATIENT PROFILE: Margaret Roberson is a 65 y.o. female who presents to the Clinic for consultation at the request of Dr. Ellison Roberson for evaluation of cholelithiasis.  PCP:  Margaret Roberson., MD  HISTORY OF PRESENT ILLNESS: Margaret Roberson reports she has been having right upper quadrant pain that radiates to her right back and right shoulder blade.  Pain has been there for 3 to 6 months.  Pain is is exacerbated by eating.  She also endorses associated diarrhea, bloating and loss of appetite.  She denies any alleviating factors.  She has been dealing with back pain and knee pain for a long time.  She takes naproxen and acetaminophen for her pains.  Denies any fever or chills.  Patient had an MRI of the lumbar spine for her back pain evaluation.  Cholelithiasis was incidentally found on that study.  I personally evaluated the MRI of the lumbar spine and identified the gallbladder stone.  Her labs shows normal AST/ALT, alkaline phosphatase and bilirubin.   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   Mixed urge and stress incontinence 11/28/2013   Uterovaginal prolapse 11/28/2013    GENERAL REVIEW OF SYSTEMS:   General ROS: negative for - chills, fatigue, fever, weight gain or weight loss Allergy and Immunology ROS: negative for - hives  Hematological and Lymphatic ROS: negative for - bleeding problems or bruising, negative for palpable nodes Endocrine ROS: negative for -  heat or cold intolerance, hair changes Respiratory ROS: negative for - cough, shortness of breath or wheezing Cardiovascular ROS: no chest pain or palpitations GI ROS: Positive for nausea, vomiting, abdominal pain, diarrhea, constipation Musculoskeletal ROS: negative for - joint swelling or muscle pain Neurological ROS: negative for - confusion, syncope Dermatological ROS: negative for pruritus and rash Psychiatric: negative for anxiety, depression, difficulty sleeping and memory loss  MEDICATIONS: Current Outpatient Medications  Medication Sig Dispense Refill   acetaminophen (TYLENOL) 500 MG tablet Take by mouth.     albuterol (PROVENTIL) 2.5 mg /3 mL (0.083 %) nebulizer solution Take 3 mLs (2.5 mg total) by nebulization every 4 (four) hours as needed for Wheezing or Shortness of Breath for up to 7 days 300 mL 0   albuterol 90 mcg/actuation inhaler Inhale 2 inhalations into the lungs every 4 (four) hours as needed for Wheezing or Shortness of Breath 1 each 1   baclofen (LIORESAL) 10 MG tablet TAKE ONE TABLET BY MOUTH TWICE DAILY 60 tablet 2   cholecalciferol (VITAMIN D3) 1,000 unit tablet Take 2,000 Units by mouth once daily.       cyanocobalamin (VITAMIN B12) 1000 MCG tablet Take 1,000 mcg by mouth once daily     gabapentin (NEURONTIN) 100 MG capsule Take 1 capsule (100 mg total) by mouth at bedtime for 90 days 90 capsule 0   ipratropium-albuteroL (DUO-NEB) nebulizer solution USE 1 VIAL VIA NEBULIZER FOUR TIMES DAILY FOR 7 DAYS 60 mL 0   magnesium oxide (MAG-OX) 400 mg tablet Take  400 mg by mouth once daily.     naproxen (NAPROSYN) 500 MG tablet TAKE 1 TABLET BY MOUTH TWICE A DAY AS NEEDED 60 tablet 5   pantoprazole (PROTONIX) 40 MG DR tablet TAKE ONE TABLET BY MOUTH TWO TIMES A DAY 180 tablet 3   rosuvastatin (CRESTOR) 5 MG tablet TAKE 1 TABLET(5 MG) BY MOUTH EVERY DAY 90 tablet 1   sertraline (ZOLOFT) 100 MG tablet Take 200 mg by mouth once daily        tiZANidine (ZANAFLEX) 4 MG tablet  TAKE 1 TABLET (4 MG TOTAL) BY MOUTH AT BEDTIME AS NEEDED 30 tablet 1   traZODone (DESYREL) 50 MG tablet TAKE ONE-HALF TO ONE TABLET BY MOUTH EVERY NIGHT AT BEDTIME     valACYclovir (VALTREX) 500 MG tablet TAKE 1 TABLET BY MOUTH TWICE A DAY (Patient taking differently: Take 1 tablet (500 mg total) by mouth 2 (two) times daily as needed) 6 tablet 6   vitamin B complex (B COMPLEX ORAL) Take by mouth     vitamin E acetate (VITAMIN E ORAL) Take by mouth.     gabapentin (NEURONTIN) 300 MG capsule Take 1 capsule (300 mg total) by mouth at bedtime for 30 days 30 capsule 5   POTASSIUM ORAL Take 99 mg by mouth. (Patient not taking: Reported on 11/01/2021)     No current facility-administered medications for this visit.    ALLERGIES: Imitrex [sumatriptan succinate], Penicillins, Sulfa (sulfonamide antibiotics), and Sulfasalazine  PAST MEDICAL HISTORY: Past Medical History:  Diagnosis Date   Abnormal menstrual cycle 03/09/2014   Cervicalgia 03/09/2014   Fibrocystic breast changes    Headache 03/09/2014   Hyperlipidemia    Migraine headache 03/09/2014   OSA (obstructive sleep apnea) 03/09/2014   Osteopenia    Tremor    Vitamin D deficiency 03/09/2014    PAST SURGICAL HISTORY: Past Surgical History:  Procedure Laterality Date   CESAREAN SECTION     COLONOSCOPY  01/08/2014 [facility])   [facility] (Brother): CBF 12/2018 Recall ltr mailed    COLONOSCOPY  05/06/2021   Tubular adenoma/FHx CC/Repeat 71yrs/CTL   COLPOPEXY FOR SUSPENSION UTEROSACRUM INTRAPERITONEAL N/A 03/22/2015   Procedure: COLPOPEXY FOR SUSPENSION UTEROSACRUM INTRAPERITONEAL;  Surgeon: Colletta Maryland, MD;  [facility];  Service: Gynecology;  Laterality: N/A;   COLPORRHAPHY FOR REPAIR CYSTOCELE ANTERIOR  03/22/2015   Procedure: COLPORRHAPHY FOR REPAIR CYSTOCELE ANTERIOR;  Surgeon: Colletta Maryland, MD;  Location: [facility] OR;  Service: Gynecology;;   CYSTOURETHROSCOPY N/A 03/22/2015   Procedure: CYSTOURETHROSCOPY;  Surgeon: Colletta Maryland, MD;  Location[facility];  Service: Gynecology;  Laterality: N/A;   EGD  10/04/2016   Gastritis: No repeat per RTE   EGD  05/06/2021   Normal EGD biopsies/Repeat as needed/CTL   HYSTERECTOMY VAGINAL N/A 03/22/2015   Procedure: (RCC) HYSTERECTOMY VAGINAL;  Surgeon: Colletta Maryland, MD;  Location[facility];  Service: Gynecology;  Laterality: N/A;   SLING FOR STRESS INCONTINENCE N/A 03/22/2015   Procedure: Lindley Magnus FOR STRESS INCONTINENCE;  Surgeon: Colletta Maryland, MD;  Location[facility];  Service: Gynecology;  Laterality: N/A;     FAMILY HISTORY: Family History  Problem Relation Age of Onset   Myocardial Infarction (Heart attack) Mother    High blood pressure (Hypertension) Mother    Stroke Mother    Breast cancer Sister    Heart disease Brother    Colon cancer Brother    High blood pressure (Hypertension) Brother    Other Brother  dissecting abdominal aneurysm   Anesthesia problems Cousin 19       died in surgery for pin in elbow 1960's,no other details,possibly MH     SOCIAL HISTORY: Social History   Socioeconomic History   Marital status: Married  Tobacco Use   Smoking status: Never   Smokeless tobacco: Never  Vaping Use   Vaping Use: Never used  Substance and Sexual Activity   Alcohol use: No   Drug use: No   Sexual activity: Never    PHYSICAL EXAM: Vitals:   11/01/21 1321  BP: 122/73  Pulse: 82   Body mass index is 29.08 kg/m. Weight: 72.1 kg (159 lb)   GENERAL: Alert, active, oriented x3  HEENT: Pupils equal reactive to light. Extraocular movements are intact. Sclera clear. Palpebral conjunctiva normal red color.Pharynx clear.  NECK: Supple with no palpable mass and no adenopathy.  LUNGS: Sound clear with no rales rhonchi or wheezes.  HEART: Regular rhythm S1 and S2 without murmur.  ABDOMEN: Soft and depressible, nontender with no palpable mass, no hepatomegaly.   EXTREMITIES: Well-developed well-nourished symmetrical  with no dependent edema.  NEUROLOGICAL: Awake alert oriented, facial expression symmetrical, moving all extremities.  REVIEW OF DATA: I have reviewed the following data today: Initial consult on 11/01/2021  Component Date Value   WBC (White Blood Cell Co* 11/01/2021 6.8    RBC (Red Blood Cell Coun* 11/01/2021 3.75 (L)    Hemoglobin 11/01/2021 12.2    Hematocrit 11/01/2021 37.5    MCV (Mean Corpuscular Vo* 11/01/2021 100.0    MCH (Mean Corpuscular He* 11/01/2021 32.5 (H)    MCHC (Mean Corpuscular H* 11/01/2021 32.5    Platelet Count 11/01/2021 259    RDW-CV (Red Cell Distrib* 11/01/2021 14.0    MPV (Mean Platelet Volum* 11/01/2021 9.6    Neutrophils 11/01/2021 3.95    Lymphocytes 11/01/2021 2.04    Monocytes 11/01/2021 0.65    Eosinophils 11/01/2021 0.07    Basophils 11/01/2021 0.05    Neutrophil % 11/01/2021 58.5    Lymphocyte % 11/01/2021 30.1    Monocyte % 11/01/2021 9.6    Eosinophil % 11/01/2021 1.0    Basophil% 11/01/2021 0.7    Immature Granulocyte % 11/01/2021 0.1    Immature Granulocyte Cou* 11/01/2021 0.01    Glucose 11/01/2021 94    Sodium 11/01/2021 140    Potassium 11/01/2021 4.4    Chloride 11/01/2021 102    Carbon Dioxide (CO2) 11/01/2021 30.4    Urea Nitrogen (BUN) 11/01/2021 14    Creatinine 11/01/2021 0.8    Glomerular Filtration Ra* 11/01/2021 72    Calcium 11/01/2021 10.1    AST  11/01/2021 12    ALT  11/01/2021 10    Alk Phos (alkaline Phosp* 11/01/2021 82    Albumin 11/01/2021 4.7    Bilirubin, Total 11/01/2021 0.5    Protein, Total 11/01/2021 7.1    A/G Ratio 11/01/2021 2.0      ASSESSMENT: Ms. Jacot is a 65 y.o. female presenting for consultation for cholelithiasis.    Patient was oriented about the diagnosis of cholelithiasis. Also oriented about what is the gallbladder, its anatomy and function and the implications of having stones. The patient was oriented about the treatment alternatives (observation vs cholecystectomy). Patient was  oriented that a low percentage of patient will continue to have similar pain symptoms even after the gallbladder is removed. Surgical technique (open vs laparoscopic) was discussed. It was also discussed the goals of the surgery (decrease the pain episodes and avoid the  risk of cholecystitis) and the risk of surgery including: bleeding, infection, common bile duct injury, stone retention, injury to other organs such as bowel, liver, stomach, other complications such as hernia, bowel obstruction among others. Also discussed with patient about anesthesia and its complications such as: reaction to medications, pneumonia, heart complications, death, among others.   I discussed with the patient that the bloating and diarrhea might not necessarily be related to the gallstones but they may also improve as well.  Cholelithiasis without cholecystitis [K80.20]  PLAN: 1.  Robotic assisted laparoscopic cholecystectomy ([id number]) 2.  CBC, CMP 3.  Do not take aspirin 5 days before the procedure 4.  Contact us if has any question or concern.   Patient verbalized understanding, all questions were answered, and were agreeable with the plan outlined above.     Herbert Pun, MD  Electronically signed by Herbert Pun, MD

## 2021-11-07 ENCOUNTER — Other Ambulatory Visit: Payer: Self-pay

## 2021-11-07 ENCOUNTER — Encounter
Admission: RE | Admit: 2021-11-07 | Discharge: 2021-11-07 | Disposition: A | Discharge status: Home / Self Care | Payer: Medicare Other | Source: Ambulatory Visit | Attending: General Surgery | Admitting: General Surgery

## 2021-11-07 HISTORY — DX: Parkinson's disease: G20

## 2021-11-07 HISTORY — DX: Other specified postprocedural states: Z98.890

## 2021-11-07 HISTORY — DX: Other specified postprocedural states: R11.2

## 2021-11-07 HISTORY — DX: Parkinsonism, unspecified: G20.C

## 2021-11-07 HISTORY — DX: Anemia, unspecified: D64.9

## 2021-11-07 HISTORY — DX: Family history of other specified conditions: Z84.89

## 2021-11-07 HISTORY — DX: Dyspnea, unspecified: R06.00

## 2021-11-07 HISTORY — DX: Gastro-esophageal reflux disease without esophagitis: K21.9

## 2021-11-07 NOTE — Progress Notes (Signed)
  Perioperative Services Pre-Admission/Anesthesia Testing     Date: 11/07/21  Name: Margaret Roberson MRN:   [id number]  Re: Change in [place] for upcoming surgery   Case: [id number] Date/Time: 11/11/21 1434   Procedure: XI ROBOTIC ASSISTED LAPAROSCOPIC CHOLECYSTECTOMY (Abdomen)   Anesthesia type: General   Pre-op diagnosis: K80.20 Cholelithiasis w/o cholecystitis   Location: [facility] OR ROOM 06 / [facility]   Surgeons: Herbert Pun, MD   Primary attending surgeon was consulted regarding consideration of therapeutic change in antimicrobial agent being used for preoperative prophylaxis in this patient's upcoming surgical case. Following analysis of the risk versus benefits, the patient's primary attending surgeon advised that it would be acceptable to discontinue the ordered clindamycin and place an order for cefazolin 2 gm IV on call to the OR. Orders for this patient were amended by me following collaborative conversation with attending surgeon taking into consideration of risk versus benefits associated with the change in therapy.  [name], MSN, APRN, FNP-C, CEN [facility]  Peri-operative Services Nurse Practitioner Phone: [phone] 11/07/21 1:50 PM

## 2021-11-07 NOTE — Patient Instructions (Signed)
Your procedure is scheduled on:11-11-21 Friday Report to the Registration Desk on the 1st floor of the [facility].Then proceed to the 2nd floor Surgery Desk in the [facility] To find out your arrival time, please call [phone] between 1PM - 3PM on:11-10-21 Thursday  REMEMBER: Instructions that are not followed completely may result in serious medical risk, up to and including death; or upon the discretion of your surgeon and anesthesiologist your surgery may need to be rescheduled.  Do not eat food after midnight the night before surgery.  No gum chewing, lozengers or hard candies.  You may however, drink CLEAR liquids up to 2 hours before you are scheduled to arrive for your surgery. Do not drink anything within 2 hours of your scheduled arrival time.  Clear liquids include: - water  - apple juice without pulp - gatorade (not RED, PURPLE, OR BLUE) - black coffee or tea (Do NOT add milk or creamers to the coffee or tea) Do NOT drink anything that is not on this list.  TAKE THESE MEDICATIONS THE MORNING OF SURGERY WITH A SIP OF WATER: -pantoprazole (PROTONIX) -rosuvastatin (CRESTOR)  -sertraline (ZOLOFT)  -baclofen (LIORESAL)  Use your albuterol (VENTOLIN HFA) 108 (90 Base) MCG/ACT inhaler the day of surgery and bring inhaler to the hospital  One week prior to surgery:,  Stop Anti-inflammatories (NSAIDS) such as meloxicam (MOBIC)Advil, Aleve, Ibuprofen, Motrin, Naproxen, Naprosyn and Aspirin based products such as Excedrin, Goodys Powder, BC Powder.You may however, continue to take Tylenol if needed for pain up until the day of surgery.  Stop ANY OVER THE COUNTER supplements/vitamins NOW (11-07-21) until after surgery (b complex vitamins capsule, cholecalciferol (VITAMIN D3) 25 MCG (1000 UNIT) tablet, Magnesium 400 MG TABS, vitamin B-12 (CYANOCOBALAMIN) 1000 MCG tablet, and vitamin E 180 MG (400 UNITS) capsule)  No Alcohol for 24 hours before or after surgery.  No Smoking  including e-cigarettes for 24 hours prior to surgery.  No chewable tobacco products for at least 6 hours prior to surgery.  No nicotine patches on the day of surgery.  Do not use any "recreational" drugs for at least a week prior to your surgery.  Please be advised that the combination of cocaine and anesthesia may have negative outcomes, up to and including death. If you test positive for cocaine, your surgery will be cancelled.  On the morning of surgery brush your teeth with toothpaste and water, you may rinse your mouth with mouthwash if you wish. Do not swallow any toothpaste or mouthwash.  Use CHG Soap as directed on instruction sheet.  Do not wear jewelry, make-up, hairpins, clips or nail polish.  Do not wear lotions, powders, or perfumes.   Do not shave body from the neck down 48 hours prior to surgery just in case you cut yourself which could leave a site for infection.  Also, freshly shaved skin may become irritated if using the CHG soap.  Contact lenses, hearing aids and dentures may not be worn into surgery.  Do not bring valuables to the hospital. [facility] is not responsible for any missing/lost belongings or valuables.   Notify your doctor if there is any change in your medical condition (cold, fever, infection).  Wear comfortable clothing (specific to your surgery type) to the hospital.  After surgery, you can help prevent lung complications by doing breathing exercises.  Take deep breaths and cough every 1-2 hours. Your doctor may order a device called an Incentive Spirometer to help you take deep breaths. When coughing  or sneezing, hold a pillow firmly against your incision with both hands. This is called "splinting." Doing this helps protect your incision. It also decreases belly discomfort.  If you are being admitted to the hospital overnight, leave your suitcase in the car. After surgery it may be brought to your room.  If you are being discharged the day of  surgery, you will not be allowed to drive home. You will need a responsible adult (18 years or older) to drive you home and stay with you that night.   If you are taking public transportation, you will need to have a responsible adult (18 years or older) with you. Please confirm with your physician that it is acceptable to use public transportation.   Please call the [facility] Dept. at [phone] if you have any questions about these instructions.  Surgery Visitation Policy:  Patients undergoing a surgery or procedure may have one family member or support person with them as long as that person is not COVID-19 positive or experiencing its symptoms.  That person may remain in the waiting area during the procedure and may rotate out with other people.  Inpatient Visitation:    Visiting hours are 7 a.m. to 8 p.m. Up to two visitors ages 16+ are allowed at one time in a patient room. The visitors may rotate out with other people during the day. Visitors must check out when they leave, or other visitors will not be allowed. One designated support person may remain overnight. The visitor must pass COVID-19 screenings, use hand sanitizer when entering and exiting the patient's room and wear a mask at all times, including in the patient's room. Patients must also wear a mask when staff or their visitor are in the room. Masking is required regardless of vaccination status.

## 2021-11-07 NOTE — Progress Notes (Signed)
  Perioperative Services Pre-Admission/Anesthesia Testing    Date: 11/07/21  Name: Margaret Roberson MRN:   [id number]  Re: (+) family history of MALIGNANT HYPERTHERMIA  Planned Surgical Procedure(s):    Case: [id number] Date/Time: 11/11/21 1434   Procedure: XI ROBOTIC ASSISTED LAPAROSCOPIC CHOLECYSTECTOMY (Abdomen)   Anesthesia type: General   Pre-op diagnosis: K80.20 Cholelithiasis w/o cholecystitis   Location: [facility] OR ROOM 06 / [facility]   Surgeons: Herbert Pun, MD   Clinical Notes:  Patient scheduled for the above procedure on 11/11/2021 with Dr. Herbert Pun, MD.  During her PAT interview with the nurse, patient made mention of the fact that her paternal cousin passed away intraoperatively secondary to the effects of MALIGNANT HYPERTHERMIA back in the 1970s.  Information has been added to medical history, placed on case posting, and this note is being entered for review by the surgical anesthetic team on the day of her procedure. Of note, patient has anesthesia (fentanyl + midazolam + propofol) with [place] here at [facility] on 05/06/2021 with no documented complications.   [name], MSN, APRN, FNP-C, CEN [facility]  Peri-operative Services Nurse Practitioner Phone: [phone] 11/07/21 11:31 AM  NOTE: This note has been prepared using Dragon dictation software. Despite my best ability to proofread, there is always the potential that unintentional transcriptional errors may still occur from this process.

## 2021-11-07 NOTE — Progress Notes (Signed)
Perioperative Services Pre-Admission/Anesthesia Testing   Date: 11/07/21 Name: Margaret Roberson MRN:   [id number]  Re: Consideration of preoperative prophylactic antibiotic change   Request sent to: Herbert Pun, MD (routed and/or faxed via [facility])  Planned Surgical Procedure(s):    Case: [id number] Date/Time: 11/11/21 1434   Procedure: XI ROBOTIC ASSISTED LAPAROSCOPIC CHOLECYSTECTOMY (Abdomen)   Anesthesia type: General   Pre-op diagnosis: K80.20 Cholelithiasis w/o cholecystitis   Location: [facility] OR ROOM 06 / [facility]   Surgeons: Herbert Pun, MD   Clinical Notes:  Patient has a documented allergy to PCN  Advising that PCN has caused her to experience a non-specific rash and "passing out" in the past.   Received cephalosporin with no documented complications CEFAZOLIN received on 03/22/2015 without documented ADRs.   Screened as appropriate for cephalosporin use during medication reconciliation No immediate angioedema, dysphagia, SOB, anaphylaxis symptoms. No severe rash involving mucous membranes or skin necrosis. No hospital admissions related to side effects of PCN/cephalosporin use.  No documented reaction to PCN or cephalosporin in the last 10 years.  Request:  As an evidence based approach to reducing the rate of incidence for post-operative SSI and the development of MDROs, could an agent with narrower coverage for preoperative prophylaxis in this patient's upcoming surgical course be considered?   Currently ordered preoperative prophylactic ABX: clindamycin.   Specifically requesting change to cephalosporin (CEFAZOLIN).   Please communicate decision with me and I will change the orders in Epic as per your direction.   Things to consider: Many patients report that they were "allergic" to PCN earlier in life, however this does not translate into a true lifelong allergy. Patients can lose sensitivity to specific IgE antibodies over time  if PCN is avoided (Kleris & Lugar, 2019).  Up to 10% of the adult population and 15% of hospitalized patients report an allergy to PCN, however clinical studies suggest that 90% of those reporting an allergy can tolerate PCN antibiotics (Kleris & Lugar, 2019).  Cross-sensitivity between PCN and cephalosporins has been documented as being as high as 10%, however this estimation included data believed to have been collected in a setting where there was contamination. Newer data suggests that the prevalence of cross-sensitivity between PCN and cephalosporins is actually estimated to be closer to 1% (Hermanides et al., 2018).   Patients labeled as PCN allergic, whether they are truly allergic or not, have been found to have inferior outcomes in terms of rates of serious infection, and these patients tend to have longer hospital stays (Roslyn, 2019).  Treatment related secondary infections, such as Clostridioides difficile, have been linked to the improper use of broad spectrum antibiotics in patients improperly labeled as PCN allergic (Kleris & Lugar, 2019).  Anaphylaxis from cephalosporins is rare and the evidence suggests that there is no increased risk of an anaphylactic type reaction when cephalosporins are used in a PCN allergic patient (Pichichero, 2006).  Citations: Hermanides J, Lemkes BA, Prins Pearla Dubonnet MW, Terreehorst I. Presumed ?-Lactam Allergy and Cross-reactivity in the Operating Theater: A Practical Approach. Anesthesiology. 2018 Aug;129(2):335-342. doi: 10.1097/ALN.0000000000002252. PMID: 24235361.  Kleris, [place]., & Lugar, P. L. (2019). Things We Do For No Reason: Failing to Question a Penicillin Allergy History. Journal of hospital medicine, 14(10), [phone]. Advance online publication. https://www.wallace-middleton.info/  Pichichero, M. E. (2006). Cephalosporins can be prescribed safely for penicillin-allergic patients. Journal of family medicine, 55(2), 106-112. Accessed:  https://cdn.mdedge.com/files/s66fs-public/Document/September-2017/5502JFP_AppliedEvidence1.pdf   [name], MSN, APRN, FNP-C, CEN [facility]  Peri-operative Services Nurse Practitioner FAX: [phone] 11/07/21 12:07 PM

## 2021-11-08 ENCOUNTER — Encounter
Admission: RE | Admit: 2021-11-08 | Discharge: 2021-11-08 | Disposition: A | Discharge status: Home / Self Care | Payer: Medicare Other | Source: Ambulatory Visit | Attending: General Surgery | Admitting: General Surgery

## 2021-11-08 DIAGNOSIS — Z01810 Encounter for preprocedural cardiovascular examination: Secondary | ICD-10-CM | POA: Insufficient documentation

## 2021-11-08 DIAGNOSIS — R0602 Shortness of breath: Secondary | ICD-10-CM | POA: Diagnosis not present

## 2021-11-11 ENCOUNTER — Ambulatory Visit: Payer: Medicare Other | Admitting: Urgent Care

## 2021-11-11 ENCOUNTER — Ambulatory Visit
Admission: RE | Admit: 2021-11-11 | Discharge: 2021-11-11 | Disposition: A | Discharge status: Home / Self Care | Payer: Medicare Other | Attending: General Surgery | Admitting: General Surgery

## 2021-11-11 ENCOUNTER — Encounter: Payer: Self-pay | Admitting: General Surgery

## 2021-11-11 ENCOUNTER — Encounter
Admission: RE | Disposition: A | Discharge status: Home / Self Care | Payer: Self-pay | Source: Home / Self Care | Attending: General Surgery

## 2021-11-11 ENCOUNTER — Other Ambulatory Visit: Payer: Self-pay

## 2021-11-11 DIAGNOSIS — F419 Anxiety disorder, unspecified: Secondary | ICD-10-CM | POA: Diagnosis not present

## 2021-11-11 DIAGNOSIS — F32A Depression, unspecified: Secondary | ICD-10-CM | POA: Insufficient documentation

## 2021-11-11 DIAGNOSIS — K8020 Calculus of gallbladder without cholecystitis without obstruction: Secondary | ICD-10-CM | POA: Diagnosis present

## 2021-11-11 DIAGNOSIS — K8010 Calculus of gallbladder with chronic cholecystitis without obstruction: Secondary | ICD-10-CM | POA: Insufficient documentation

## 2021-11-11 SURGERY — CHOLECYSTECTOMY, ROBOT-ASSISTED, LAPAROSCOPIC
Anesthesia: General | Site: Abdomen

## 2021-11-11 MED ORDER — ACETAMINOPHEN 10 MG/ML IV SOLN
INTRAVENOUS | Status: AC
  Filled 2021-11-11: qty 100

## 2021-11-11 MED ORDER — ONDANSETRON HCL 4 MG PO TABS: 4.0000 mg | ORAL_TABLET | Freq: Every day | ORAL | 1 refills | Status: AC | PRN

## 2021-11-11 MED ORDER — ONDANSETRON HCL 4 MG/2ML IJ SOLN
INTRAMUSCULAR | Status: DC | PRN
  Administered 2021-11-11 (×2): 4 mg via INTRAVENOUS

## 2021-11-11 MED ORDER — DEXAMETHASONE SODIUM PHOSPHATE 10 MG/ML IJ SOLN
INTRAMUSCULAR | Status: DC | PRN
Start: 2021-11-11 — End: 2021-11-11
  Administered 2021-11-11: 10 mg via INTRAVENOUS

## 2021-11-11 MED ORDER — OXYCODONE HCL 5 MG PO TABS
ORAL_TABLET | ORAL | Status: AC
  Filled 2021-11-11: qty 1

## 2021-11-11 MED ORDER — HYDROCODONE-ACETAMINOPHEN 5-325 MG PO TABS: 1.0000 | ORAL_TABLET | ORAL | 0 refills | Status: AC | PRN

## 2021-11-11 MED ORDER — LIDOCAINE HCL (CARDIAC) PF 100 MG/5ML IV SOSY
PREFILLED_SYRINGE | INTRAVENOUS | Status: DC | PRN
  Administered 2021-11-11: 100 mg via INTRAVENOUS

## 2021-11-11 MED ORDER — PROMETHAZINE HCL 25 MG/ML IJ SOLN: 6.2500 mg | INTRAMUSCULAR | Status: DC | PRN

## 2021-11-11 MED ORDER — APREPITANT 40 MG PO CAPS: 40.0000 mg | ORAL_CAPSULE | Freq: Once | ORAL | Status: AC

## 2021-11-11 MED ORDER — FENTANYL CITRATE (PF) 100 MCG/2ML IJ SOLN
INTRAMUSCULAR | Status: AC
  Filled 2021-11-11: qty 2

## 2021-11-11 MED ORDER — APREPITANT 40 MG PO CAPS
ORAL_CAPSULE | ORAL | Status: AC
  Administered 2021-11-11: 40 mg via ORAL
  Filled 2021-11-11: qty 1

## 2021-11-11 MED ORDER — CHLORHEXIDINE GLUCONATE 0.12 % MT SOLN: 15.0000 mL | Freq: Once | OROMUCOSAL | Status: AC

## 2021-11-11 MED ORDER — ACETAMINOPHEN 10 MG/ML IV SOLN
INTRAVENOUS | Status: DC | PRN
  Administered 2021-11-11: 1000 mg via INTRAVENOUS

## 2021-11-11 MED ORDER — PROPOFOL 10 MG/ML IV BOLUS
INTRAVENOUS | Status: DC | PRN
  Administered 2021-11-11: 170 mg via INTRAVENOUS

## 2021-11-11 MED ORDER — CEFAZOLIN SODIUM-DEXTROSE 2-4 GM/100ML-% IV SOLN
2.0000 g | Freq: Once | INTRAVENOUS | Status: AC
  Administered 2021-11-11: 2 g via INTRAVENOUS

## 2021-11-11 MED ORDER — LACTATED RINGERS IV SOLN: INTRAVENOUS | Status: DC

## 2021-11-11 MED ORDER — FENTANYL CITRATE (PF) 100 MCG/2ML IJ SOLN
25.0000 ug | INTRAMUSCULAR | Status: DC | PRN
  Administered 2021-11-11: 25 ug via INTRAVENOUS
  Administered 2021-11-11: 50 ug via INTRAVENOUS

## 2021-11-11 MED ORDER — PROPOFOL 500 MG/50ML IV EMUL
INTRAVENOUS | Status: AC
  Filled 2021-11-11: qty 50

## 2021-11-11 MED ORDER — BUPIVACAINE-EPINEPHRINE 0.25% -1:200000 IJ SOLN
INTRAMUSCULAR | Status: DC | PRN
  Administered 2021-11-11: 30 mL

## 2021-11-11 MED ORDER — SUGAMMADEX SODIUM 500 MG/5ML IV SOLN
INTRAVENOUS | Status: DC | PRN
  Administered 2021-11-11: 400 mg via INTRAVENOUS

## 2021-11-11 MED ORDER — CHLORHEXIDINE GLUCONATE 0.12 % MT SOLN
OROMUCOSAL | Status: AC
  Administered 2021-11-11: 15 mL via OROMUCOSAL
  Filled 2021-11-11: qty 15

## 2021-11-11 MED ORDER — 0.9 % SODIUM CHLORIDE (POUR BTL) OPTIME
TOPICAL | Status: DC | PRN
  Administered 2021-11-11: 100 mL

## 2021-11-11 MED ORDER — KETOROLAC TROMETHAMINE 30 MG/ML IJ SOLN
INTRAMUSCULAR | Status: DC | PRN
  Administered 2021-11-11: 30 mg via INTRAVENOUS

## 2021-11-11 MED ORDER — PROPOFOL 500 MG/50ML IV EMUL
INTRAVENOUS | Status: DC | PRN
  Administered 2021-11-11: 140 ug/kg/min via INTRAVENOUS

## 2021-11-11 MED ORDER — MIDAZOLAM HCL 2 MG/2ML IJ SOLN
INTRAMUSCULAR | Status: AC
  Filled 2021-11-11: qty 2

## 2021-11-11 MED ORDER — FENTANYL CITRATE (PF) 100 MCG/2ML IJ SOLN
INTRAMUSCULAR | Status: AC
  Administered 2021-11-11: 25 ug via INTRAVENOUS
  Filled 2021-11-11: qty 2

## 2021-11-11 MED ORDER — DEXMEDETOMIDINE (PRECEDEX) IN NS 20 MCG/5ML (4 MCG/ML) IV SYRINGE
PREFILLED_SYRINGE | INTRAVENOUS | Status: DC | PRN
  Administered 2021-11-11: 8 ug via INTRAVENOUS

## 2021-11-11 MED ORDER — INDOCYANINE GREEN 25 MG IV SOLR
1.2500 mg | Freq: Once | INTRAVENOUS | Status: AC
  Administered 2021-11-11: 1.25 mg via INTRAVENOUS
  Filled 2021-11-11: qty 0.5

## 2021-11-11 MED ORDER — MIDAZOLAM HCL 2 MG/2ML IJ SOLN
INTRAMUSCULAR | Status: DC | PRN
  Administered 2021-11-11: 2 mg via INTRAVENOUS

## 2021-11-11 MED ORDER — BUPIVACAINE-EPINEPHRINE (PF) 0.25% -1:200000 IJ SOLN
INTRAMUSCULAR | Status: AC
  Filled 2021-11-11: qty 30

## 2021-11-11 MED ORDER — ORAL CARE MOUTH RINSE: 15.0000 mL | Freq: Once | OROMUCOSAL | Status: AC

## 2021-11-11 MED ORDER — ROCURONIUM BROMIDE 100 MG/10ML IV SOLN
INTRAVENOUS | Status: DC | PRN
  Administered 2021-11-11: 60 mg via INTRAVENOUS

## 2021-11-11 MED ORDER — CEFAZOLIN SODIUM-DEXTROSE 2-4 GM/100ML-% IV SOLN
INTRAVENOUS | Status: AC
  Filled 2021-11-11: qty 100

## 2021-11-11 MED ORDER — FENTANYL CITRATE (PF) 100 MCG/2ML IJ SOLN
INTRAMUSCULAR | Status: DC | PRN
  Administered 2021-11-11 (×2): 50 ug via INTRAVENOUS

## 2021-11-11 MED ORDER — OXYCODONE HCL 5 MG PO TABS
5.0000 mg | ORAL_TABLET | Freq: Once | ORAL | Status: AC
  Administered 2021-11-11: 5 mg via ORAL

## 2021-11-11 MED ORDER — GLYCOPYRROLATE 0.2 MG/ML IJ SOLN
INTRAMUSCULAR | Status: DC | PRN
  Administered 2021-11-11: .2 mg via INTRAVENOUS

## 2021-11-11 SURGICAL SUPPLY — 42 items
BLADE SURG SZ11 CARB STEEL (BLADE) ×2 IMPLANT
CANNULA REDUC XI 12-8 STAPL (CANNULA) ×1
CANNULA REDUCER 12-8 DVNC XI (CANNULA) ×1 IMPLANT
CLIP LIGATING HEMO O LOK GREEN (MISCELLANEOUS) ×2 IMPLANT
DERMABOND ADVANCED (GAUZE/BANDAGES/DRESSINGS) ×1
DERMABOND ADVANCED .7 DNX12 (GAUZE/BANDAGES/DRESSINGS) ×1 IMPLANT
DRAPE ARM DVNC X/XI (DISPOSABLE) ×4 IMPLANT
DRAPE COLUMN DVNC XI (DISPOSABLE) ×1 IMPLANT
DRAPE DA VINCI XI ARM (DISPOSABLE) ×4
DRAPE DA VINCI XI COLUMN (DISPOSABLE) ×1
ELECT REM PT RETURN 9FT ADLT (ELECTROSURGICAL) ×2
ELECTRODE REM PT RTRN 9FT ADLT (ELECTROSURGICAL) ×1 IMPLANT
GAUZE 4X4 16PLY ~~LOC~~+RFID DBL (SPONGE) ×2 IMPLANT
GLOVE SURG ENC MOIS LTX SZ6.5 (GLOVE) ×4 IMPLANT
GLOVE SURG UNDER POLY LF SZ6.5 (GLOVE) ×4 IMPLANT
GOWN STRL REUS W/ TWL LRG LVL3 (GOWN DISPOSABLE) ×3 IMPLANT
GOWN STRL REUS W/TWL LRG LVL3 (GOWN DISPOSABLE) ×3
GRASPER SUT TROCAR 14GX15 (MISCELLANEOUS) ×2 IMPLANT
KIT PINK PAD W/HEAD ARE REST (MISCELLANEOUS) ×2
KIT PINK PAD W/HEAD ARM REST (MISCELLANEOUS) ×1 IMPLANT
LABEL OR SOLS (LABEL) ×2 IMPLANT
MANIFOLD NEPTUNE II (INSTRUMENTS) ×2 IMPLANT
NDL INSUFFLATION 14GA 120MM (NEEDLE) ×1 IMPLANT
NEEDLE HYPO 22GX1.5 SAFETY (NEEDLE) ×2 IMPLANT
NEEDLE INSUFFLATION 14GA 120MM (NEEDLE) ×2 IMPLANT
NS IRRIG 500ML POUR BTL (IV SOLUTION) ×2 IMPLANT
OBTURATOR OPTICAL STANDARD 8MM (TROCAR) ×1
OBTURATOR OPTICAL STND 8 DVNC (TROCAR) ×1
OBTURATOR OPTICALSTD 8 DVNC (TROCAR) ×1 IMPLANT
PACK LAP CHOLECYSTECTOMY (MISCELLANEOUS) ×2 IMPLANT
POUCH SPECIMEN RETRIEVAL 10MM (ENDOMECHANICALS) ×2 IMPLANT
SEAL CANN UNIV 5-8 DVNC XI (MISCELLANEOUS) ×3 IMPLANT
SEAL XI 5MM-8MM UNIVERSAL (MISCELLANEOUS) ×3
SET TUBE SMOKE EVAC HIGH FLOW (TUBING) ×2 IMPLANT
SOLUTION ELECTROLUBE (MISCELLANEOUS) ×2 IMPLANT
STAPLER CANNULA SEAL DVNC XI (STAPLE) ×1 IMPLANT
STAPLER CANNULA SEAL XI (STAPLE) ×1
SUT MNCRL 4-0 (SUTURE) ×1
SUT MNCRL 4-0 27XMFL (SUTURE) ×1
SUT VICRYL 0 AB UR-6 (SUTURE) ×2 IMPLANT
SUTURE MNCRL 4-0 27XMF (SUTURE) ×1 IMPLANT
WATER STERILE IRR 500ML POUR (IV SOLUTION) ×2 IMPLANT

## 2021-11-11 NOTE — Transfer of Care (Signed)
Immediate Anesthesia Transfer of Care Note  Patient: Margaret Roberson  Procedure(s) Performed: XI ROBOTIC ASSISTED LAPAROSCOPIC CHOLECYSTECTOMY (Abdomen)  Patient Location: PACU  Anesthesia Type:General  Level of Consciousness: awake, alert  and oriented  Airway & Oxygen Therapy: Patient Spontanous Breathing and Patient connected to face mask oxygen  Post-op Assessment: Report given to RN and Post -op Vital signs reviewed and stable  Post vital signs: Reviewed and stable  Last Vitals:  Vitals Value Taken Time  BP 103/56 11/11/21 1504  Temp    Pulse 96 11/11/21 1504  Resp 21 11/11/21 1504  SpO2 100 % 11/11/21 1504    Last Pain:  Vitals:   11/11/21 1337  TempSrc: Oral  PainSc: 4          Complications: No notable events documented.

## 2021-11-11 NOTE — Anesthesia Preprocedure Evaluation (Signed)
Anesthesia Evaluation  Patient identified by MRN, date of birth, ID band Patient awake    Reviewed: Allergy & Precautions, NPO status , Patient's Chart, lab work & pertinent test results  History of Anesthesia Complications (+) PONV, Family history of anesthesia reaction and history of anesthetic complications (cousin with ?malignant hyperthermia)  Airway Mallampati: II       Dental  (+) Dental Advidsory Given, Teeth Intact   Pulmonary neg shortness of breath, sleep apnea (not officially diagnosed) , neg recent URI, Not current smoker,           Cardiovascular Exercise Tolerance: Good (-) hypertension(-) angina(-) Past MI and (-) CHF (-) dysrhythmias (-) Valvular Problems/Murmurs     Neuro/Psych neg Seizures PSYCHIATRIC DISORDERS Anxiety Depression Pt with muscle contractions, considered for Parkinson's, but ruled out.    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: Bowel trouble 03/09/2014: Cervicalgia No date: Depression No date: Diffuse cystic mastopathy No date: Dyspnea     Comment:  cannot walk a mile without getting sob No date: Family history of adverse reaction to anesthesia     Comment:  cousin on dad's side died from malignant hyperthermia in              the 1970's No date: Family history of malignant neoplasm of breast No date: Family history of malignant neoplasm of gastrointestinal  tract No date: Fibrocystic breast changes No date: GERD (gastroesophageal reflux disease) 2004: Gout     Comment:  pseudogout No date: Headache disorder     Comment:  R sided, etiology unknown  No date: HLD (hyperlipidemia) No date: Insomnia No date: Loss of  memory 2012: Lump or mass in breast No date: Osteopenia No date: Parkinsonian features No date: PONV (postoperative nausea and vomiting) No date: Pseudogout No date: Urinary incontinence No date: Uterine prolapse No date: Vitamin D deficiency   Reproductive/Obstetrics negative OB ROS                             Anesthesia Physical  Anesthesia Plan  ASA: 3  Anesthesia Plan: General   Post-op Pain Management:    Induction: Intravenous  PONV Risk Score and Plan: 4 or greater and Propofol infusion, TIVA, Treatment may vary due to age or medical condition, Aprepitant, Ondansetron, Dexamethasone and Midazolam  Airway Management Planned: Oral ETT  Additional Equipment:   Intra-op Plan:   Post-operative Plan: Extubation in OR  Informed Consent: I have reviewed the patients History and Physical, chart, labs and discussed the procedure including the risks, benefits and alternatives for the proposed anesthesia with the patient or authorized representative who has indicated his/her understanding and acceptance.       Plan Discussed with:   Anesthesia Plan Comments:         Anesthesia Quick Evaluation

## 2021-11-11 NOTE — Discharge Instructions (Addendum)

## 2021-11-11 NOTE — Anesthesia Postprocedure Evaluation (Signed)
Anesthesia Post Note  Patient: Margaret Roberson  Procedure(s) Performed: XI ROBOTIC ASSISTED LAPAROSCOPIC CHOLECYSTECTOMY (Abdomen)  Patient location during evaluation: PACU Anesthesia Type: General Level of consciousness: awake and alert Pain management: pain level controlled Vital Signs Assessment: post-procedure vital signs reviewed and stable Respiratory status: spontaneous breathing, nonlabored ventilation, respiratory function stable and patient connected to nasal cannula oxygen Cardiovascular status: blood pressure returned to baseline and stable Postop Assessment: no apparent nausea or vomiting Anesthetic complications: no   No notable events documented.   Last Vitals:  Vitals:   11/11/21 1530 11/11/21 1617  BP: 114/63 114/66  Pulse: 90 79  Resp: 19 17  Temp: (!) 36.3 C 36.6 C  SpO2: 95% 96%    Last Pain:  Vitals:   11/11/21 1617  TempSrc: Temporal  PainSc: 2                  Martha Clan

## 2021-11-11 NOTE — Anesthesia Procedure Notes (Signed)
Procedure Name: Intubation Date/Time: 11/11/2021 2:09 PM Performed by: Kelton Pillar, CRNA Pre-anesthesia Checklist: Patient identified, Emergency Drugs available, Suction available and Patient being monitored Patient Re-evaluated:Patient Re-evaluated prior to induction Oxygen Delivery Method: Circle system utilized Preoxygenation: Pre-oxygenation with 100% oxygen Induction Type: IV induction Ventilation: Mask ventilation without difficulty Laryngoscope Size: McGraph and 3 Grade View: Grade I Tube type: Oral Tube size: 6.5 mm Number of attempts: 1 Airway Equipment and Method: Stylet and Oral airway Placement Confirmation: ETT inserted through vocal cords under direct vision, positive ETCO2, breath sounds checked- equal and bilateral and CO2 detector Secured at: 21 cm Tube secured with: Tape Dental Injury: Teeth and Oropharynx as per pre-operative assessment

## 2021-11-11 NOTE — Anesthesia Preprocedure Evaluation (Deleted)
Anesthesia Evaluation                                  Anesthesia Evaluation  Patient identified by MRN, date of birth, ID band Patient awake    Reviewed: Allergy & Precautions, NPO status , Patient's Chart, lab work & pertinent test results  History of Anesthesia Complications (+) Family history of anesthesia reaction and history of anesthetic complications (cousin with ?malignant hyperthermia)  Airway Mallampati: II       Dental   Pulmonary sleep apnea (not officially diagnosed) , Not current smoker,           Cardiovascular (-) hypertension(-) Past MI and (-) CHF (-) dysrhythmias (-) Valvular Problems/Murmurs     Neuro/Psych neg Seizures Anxiety Depression Pt with muscle contractions, considered for Parkinson's, but ruled out.    GI/Hepatic Neg liver ROS, neg GERD  ,  Endo/Other  neg diabetes  Renal/GU negative Renal ROS     Musculoskeletal   Abdominal   Peds  Hematology   Anesthesia Other Findings   Reproductive/Obstetrics                             Anesthesia Physical Anesthesia Plan  ASA: 3  Anesthesia Plan: General   Post-op Pain Management:    Induction: Intravenous  PONV Risk Score and Plan: 3 and Propofol infusion, TIVA and Treatment may vary due to age or medical condition  Airway Management Planned: Nasal Cannula  Additional Equipment:   Intra-op Plan:   Post-operative Plan:   Informed Consent: I have reviewed the patients History and Physical, chart, labs and discussed the procedure including the risks, benefits and alternatives for the proposed anesthesia with the patient or authorized representative who has indicated his/her understanding and acceptance.       Plan Discussed with:   Anesthesia Plan Comments:         Anesthesia Quick Evaluation  Anesthesia Physical Anesthesia Plan Anesthesia Quick Evaluation

## 2021-11-11 NOTE — Interval H&P Note (Signed)
History and Physical Interval Note:  11/11/2021 1:58 PM  Margaret Roberson  has presented today for surgery, with the diagnosis of K80.20 Cholelithiasis w/o cholecystitis.  The various methods of treatment have been discussed with the patient and family. After consideration of risks, benefits and other options for treatment, the patient has consented to  Procedure(s): XI ROBOTIC Baird (N/A) as a surgical intervention.  The patient's history has been reviewed, patient examined, no change in status, stable for surgery.  I have reviewed the patient's chart and labs.  Questions were answered to the patient's satisfaction.     Herbert Pun

## 2021-11-11 NOTE — Op Note (Signed)
Preoperative diagnosis: Cholelithiasis  Postoperative diagnosis: Cholelithiasis  Procedure: Robotic Assisted Laparoscopic Cholecystectomy.   Anesthesia: GETA   Surgeon: Dr. Windell Moment  Wound Classification: Clean Contaminated  Indications: Patient is a 65 y.o. female developed right upper quadrant pain and on workup was found to have cholelithiasis with a normal common duct. Robotic Assisted Laparoscopic cholecystectomy was elected.  Findings: Critical view of safety achieved Cystic duct and artery identified, ligated and divided Adequate hemostasis     Description of procedure: The patient was placed on the operating table in the supine position. General anesthesia was induced. A time-out was completed verifying correct patient, procedure, site, positioning, and implant(s) and/or special equipment prior to beginning this procedure. An orogastric tube was placed. The abdomen was prepped and draped in the usual sterile fashion.  An incision was made in a natural skin line below the umbilicus.  The fascia was elevated and the Veress needle inserted. Proper position was confirmed by aspiration and saline meniscus test.  The abdomen was insufflated with carbon dioxide to a pressure of 15 mmHg. The patient tolerated insufflation well. A 8-mm trocar was then inserted in optiview fashion.  The laparoscope was inserted and the abdomen inspected. No injuries from initial trocar placement were noted. Additional trocars were then inserted in the following locations: an 8-mm trocar in the left lateral abdomen, and another two 8-mm trocars to the right side of the abdomen 5 cm appart. The umbilical trocar was changed to a 12 mm trocar all under direct visualization. The abdomen was inspected and no abnormalities were found. The table was placed in the reverse Trendelenburg position with the right side up. The robotic arms were docked and target anatomy identified. Instrument inserted under direct  visualization.  Filmy adhesions between the gallbladder and omentum, duodenum and transverse colon were lysed with electrocautery. The dome of the gallbladder was grasped with a prograsp and retracted over the dome of the liver. The infundibulum was also grasped with an atraumatic grasper and retracted toward the right lower quadrant. This maneuver exposed Calots triangle. The peritoneum overlying the gallbladder infundibulum was then incised and the cystic duct and cystic artery identified and circumferentially dissected. Critical view of safety reviewed before ligating any structure. Firefly images taken to visualize biliary ducts. The cystic duct and cystic artery were then doubly clipped and divided close to the gallbladder.  The gallbladder was then dissected from its peritoneal attachments by electrocautery. Hemostasis was checked and the gallbladder and contained stones were removed using an endoscopic retrieval bag. The gallbladder was passed off the table as a specimen. There was no evidence of bleeding from the gallbladder fossa or cystic artery or leakage of the bile from the cystic duct stump. Secondary trocars were removed under direct vision. No bleeding was noted. The robotic arms were undoked. The scope was withdrawn and the umbilical trocar removed. The abdomen was allowed to collapse. The fascia of the 83mm trocar sites was closed with figure-of-eight 0 vicryl sutures. The skin was closed with subcuticular sutures of 4-0 monocryl and topical skin adhesive. The orogastric tube was removed.  The patient tolerated the procedure well and was taken to the postanesthesia care unit in stable condition.   Specimen: Gallbladder  Complications: None  EBL: 5 mL

## 2021-11-15 LAB — SURGICAL PATHOLOGY

## 2022-02-03 ENCOUNTER — Encounter: Payer: Self-pay | Admitting: General Surgery

## 2022-05-01 ENCOUNTER — Other Ambulatory Visit (HOSPITAL_COMMUNITY): Payer: Self-pay | Admitting: Student

## 2022-05-01 ENCOUNTER — Other Ambulatory Visit: Payer: Self-pay | Admitting: Student

## 2022-05-01 DIAGNOSIS — R569 Unspecified convulsions: Secondary | ICD-10-CM

## 2022-05-01 DIAGNOSIS — R413 Other amnesia: Secondary | ICD-10-CM

## 2022-05-17 ENCOUNTER — Encounter (HOSPITAL_COMMUNITY): Payer: Self-pay

## 2022-05-17 ENCOUNTER — Ambulatory Visit (HOSPITAL_COMMUNITY): Admission: RE | Admit: 2022-05-17 | Payer: Medicare Other | Source: Ambulatory Visit

## 2022-06-01 ENCOUNTER — Encounter: Payer: Self-pay | Admitting: *Deleted

## 2022-06-02 ENCOUNTER — Encounter
Admission: RE | Disposition: A | Discharge status: Home / Self Care | Payer: Self-pay | Source: Ambulatory Visit | Attending: Gastroenterology

## 2022-06-02 ENCOUNTER — Ambulatory Visit: Payer: Medicare Other | Admitting: Anesthesiology

## 2022-06-02 ENCOUNTER — Ambulatory Visit
Admission: RE | Admit: 2022-06-02 | Discharge: 2022-06-02 | Disposition: A | Discharge status: Home / Self Care | Payer: Medicare Other | Source: Ambulatory Visit | Attending: Gastroenterology | Admitting: Gastroenterology

## 2022-06-02 ENCOUNTER — Encounter: Payer: Self-pay | Admitting: *Deleted

## 2022-06-02 ENCOUNTER — Other Ambulatory Visit: Payer: Self-pay

## 2022-06-02 DIAGNOSIS — K219 Gastro-esophageal reflux disease without esophagitis: Secondary | ICD-10-CM | POA: Insufficient documentation

## 2022-06-02 DIAGNOSIS — Z79899 Other long term (current) drug therapy: Secondary | ICD-10-CM | POA: Insufficient documentation

## 2022-06-02 DIAGNOSIS — F32A Depression, unspecified: Secondary | ICD-10-CM | POA: Insufficient documentation

## 2022-06-02 DIAGNOSIS — K921 Melena: Secondary | ICD-10-CM | POA: Insufficient documentation

## 2022-06-02 DIAGNOSIS — G4730 Sleep apnea, unspecified: Secondary | ICD-10-CM | POA: Diagnosis not present

## 2022-06-02 DIAGNOSIS — F419 Anxiety disorder, unspecified: Secondary | ICD-10-CM | POA: Insufficient documentation

## 2022-06-02 HISTORY — PX: ESOPHAGOGASTRODUODENOSCOPY (EGD) WITH PROPOFOL: SHX5813

## 2022-06-02 SURGERY — ESOPHAGOGASTRODUODENOSCOPY (EGD) WITH PROPOFOL
Anesthesia: General

## 2022-06-02 MED ORDER — PROPOFOL 10 MG/ML IV BOLUS
INTRAVENOUS | Status: DC | PRN
  Administered 2022-06-02: 120 mg via INTRAVENOUS
  Administered 2022-06-02: 40 mg via INTRAVENOUS

## 2022-06-02 MED ORDER — LIDOCAINE HCL (CARDIAC) PF 100 MG/5ML IV SOSY
PREFILLED_SYRINGE | INTRAVENOUS | Status: DC | PRN
  Administered 2022-06-02: 100 mg via INTRAVENOUS

## 2022-06-02 MED ORDER — SODIUM CHLORIDE 0.9 % IV SOLN: INTRAVENOUS | Status: DC

## 2022-06-02 NOTE — Transfer of Care (Signed)
Immediate Anesthesia Transfer of Care Note  Patient: Margaret Roberson  Procedure(s) Performed: ESOPHAGOGASTRODUODENOSCOPY (EGD) WITH PROPOFOL  Patient Location: Endoscopy Unit  Anesthesia Type:General  Level of Consciousness: drowsy  Airway & Oxygen Therapy: Patient Spontanous Breathing and Patient connected to nasal cannula oxygen  Post-op Assessment: Report given to RN, Post -op Vital signs reviewed and stable and Patient moving all extremities  Post vital signs: Reviewed and stable  Last Vitals:  Vitals Value Taken Time  BP 105/58 06/02/22 1258  Temp    Pulse 72 06/02/22 1258  Resp 18 06/02/22 1258  SpO2 98 % 06/02/22 1258  Vitals shown include unvalidated device data.  Last Pain:  Vitals:   06/02/22 1237  TempSrc: Temporal  PainSc: 0-No pain         Complications: No notable events documented.

## 2022-06-02 NOTE — Anesthesia Postprocedure Evaluation (Signed)
Anesthesia Post Note  Patient: Margaret Roberson  Procedure(s) Performed: ESOPHAGOGASTRODUODENOSCOPY (EGD) WITH PROPOFOL  Patient location during evaluation: PACU Anesthesia Type: General Level of consciousness: awake and awake and alert Pain management: pain level controlled Vital Signs Assessment: post-procedure vital signs reviewed and stable Respiratory status: nonlabored ventilation and respiratory function stable Cardiovascular status: stable Anesthetic complications: no   No notable events documented.   Last Vitals:  Vitals:   06/02/22 1307 06/02/22 1317  BP: 112/71 110/65  Pulse: 69 71  Resp: 17 15  Temp:    SpO2: 98% 98%    Last Pain:  Vitals:   06/02/22 1317  TempSrc:   PainSc: 0-No pain                 VAN STAVEREN,Hardy Harcum

## 2022-06-02 NOTE — Op Note (Signed)
[facility] Gastroenterology Patient Name: Margaret Roberson Procedure Date: 06/02/2022 12:27 PM MRN: [id number] Account #: [id number] Date of Birth: September 19, 1956 Admit Type: Outpatient Age: 66 Room: [facility] ENDO ROOM 3 Gender: Female Note Status: Finalized Instrument Name: Upper Endoscope [id number] Procedure:             Upper GI endoscopy Indications:           Melena Providers:             Andrey Farmer MD, MD Referring MD:          Sofie Hartigan (Referring MD) Medicines:             Monitored Anesthesia Care Complications:         No immediate complications. Procedure:             Pre-Anesthesia Assessment:                        - Prior to the procedure, a History and Physical was                         performed, and patient medications and allergies were                         reviewed. The patient is competent. The risks and                         benefits of the procedure and the sedation options and                         risks were discussed with the patient. All questions                         were answered and informed consent was obtained.                         Patient identification and proposed procedure were                         verified by the physician, the nurse, the                         anesthesiologist, the anesthetist and the technician                         in the endoscopy suite. Mental Status Examination:                         alert and oriented. Airway Examination: normal                         oropharyngeal airway and neck mobility. Respiratory                         Examination: clear to auscultation. CV Examination:                         normal. Prophylactic Antibiotics: The patient does not  require prophylactic antibiotics. Prior                         Anticoagulants: The patient has taken no previous                         anticoagulant or antiplatelet agents. ASA Grade                          Assessment: II - A patient with mild systemic disease.                         After reviewing the risks and benefits, the patient                         was deemed in satisfactory condition to undergo the                         procedure. The anesthesia plan was to use monitored                         anesthesia care (MAC). Immediately prior to                         administration of medications, the patient was                         re-assessed for adequacy to receive sedatives. The                         heart rate, respiratory rate, oxygen saturations,                         blood pressure, adequacy of pulmonary ventilation, and                         response to care were monitored throughout the                         procedure. The physical status of the patient was                         re-assessed after the procedure.                        After obtaining informed consent, the endoscope was                         passed under direct vision. Throughout the procedure,                         the patient's blood pressure, pulse, and oxygen                         saturations were monitored continuously. The                         Endosonoscope was introduced through the mouth, and  advanced to the second part of duodenum. The upper GI                         endoscopy was accomplished without difficulty. The                         patient tolerated the procedure well. Findings:      The examined esophagus was normal.      The entire examined stomach was normal.      The examined duodenum was normal. Impression:            - Normal esophagus.                        - Normal stomach.                        - Normal examined duodenum.                        - No specimens collected. Recommendation:        - Discharge patient to home.                        - Resume previous diet.                        - Continue present medications.                         - Return to referring physician as previously                         scheduled. Procedure Code(s):     --- Professional ---                        [phone], Esophagogastroduodenoscopy, flexible,                         transoral; diagnostic, including collection of                         specimen(s) by brushing or washing, when performed                         (separate procedure) Diagnosis Code(s):     --- Professional ---                        K92.1, Melena (includes Hematochezia) CPT copyright 2019 [employer] Association. All rights reserved. The codes documented in this report are preliminary and upon coder review may  be revised to meet current compliance requirements. Andrey Farmer MD, MD 06/02/2022 12:58:26 PM Number of Addenda: 0 Note Initiated On: 06/02/2022 12:27 PM Estimated Blood Loss:  Estimated blood loss: none.      [facility]

## 2022-06-02 NOTE — Anesthesia Preprocedure Evaluation (Signed)
Anesthesia Evaluation  Patient identified by MRN, date of birth, ID band Patient awake    Reviewed: Allergy & Precautions, NPO status , Patient's Chart, lab work & pertinent test results  History of Anesthesia Complications (+) PONV and history of anesthetic complications  Airway Mallampati: III  TM Distance: >3 FB Neck ROM: Full    Dental  (+) Teeth Intact   Pulmonary sleep apnea ,    breath sounds clear to auscultation       Cardiovascular Exercise Tolerance: Good  Rhythm:Regular     Neuro/Psych  Headaches, Anxiety Depression    GI/Hepatic Neg liver ROS, GERD  ,  Endo/Other  negative endocrine ROS  Renal/GU negative Renal ROS  negative genitourinary   Musculoskeletal   Abdominal Normal abdominal exam  (+)   Peds negative pediatric ROS (+)  Hematology  (+) Blood dyscrasia, anemia ,   Anesthesia Other Findings   Reproductive/Obstetrics                             Anesthesia Physical Anesthesia Plan  ASA: 2  Anesthesia Plan: General   Post-op Pain Management:    Induction: Intravenous  PONV Risk Score and Plan:   Airway Management Planned: Natural Airway  Additional Equipment:   Intra-op Plan:   Post-operative Plan:   Informed Consent: I have reviewed the patients History and Physical, chart, labs and discussed the procedure including the risks, benefits and alternatives for the proposed anesthesia with the patient or authorized representative who has indicated his/her understanding and acceptance.       Plan Discussed with: CRNA and Surgeon  Anesthesia Plan Comments:         Anesthesia Quick Evaluation

## 2022-06-02 NOTE — H&P (Signed)
Outpatient short stay form Pre-procedure 06/02/2022  Margaret Rubenstein, MD  Primary Physician: Sofie Hartigan, MD  Reason for visit:  Melena  History of present illness:  66 y/o lady with history of IBS-D and arthritis on NSAIDS but taking PPI prophylaxis here for EGD for report of melena. Hemoglobin stable. No blood thinners.   No current facility-administered medications for this encounter.  Medications Prior to Admission  Medication Sig Dispense Refill Last Dose   baclofen (LIORESAL) 10 MG tablet Take 10 mg by mouth 2 (two) times daily.   06/01/2022   cholestyramine (QUESTRAN) 4 g packet Take 4 g by mouth in the morning.      lamoTRIgine (LAMICTAL) 100 MG tablet Take 100 mg by mouth 2 (two) times daily.      pantoprazole (PROTONIX) 40 MG tablet Take 40 mg by mouth 2 (two) times daily.   06/01/2022   Potassium 99 MG TABS Take by mouth.      acetaminophen (TYLENOL) 500 MG tablet Take 500-1,000 mg by mouth every 6 (six) hours as needed for moderate pain.      albuterol (PROVENTIL) (2.5 MG/3ML) 0.083% nebulizer solution Take 2.5 mg by nebulization every 6 (six) hours as needed for wheezing or shortness of breath.      albuterol (VENTOLIN HFA) 108 (90 Base) MCG/ACT inhaler Inhale 2 puffs into the lungs every 4 (four) hours as needed for wheezing or shortness of breath. 18 g 0    b complex vitamins capsule Take 1 capsule by mouth as needed.      cholecalciferol (VITAMIN D3) 25 MCG (1000 UNIT) tablet Take 1,000 Units by mouth 2 (two) times daily.      gabapentin (NEURONTIN) 100 MG capsule Take 100 mg by mouth at bedtime.      Magnesium 400 MG TABS Take 400 mg by mouth as needed.      meloxicam (MOBIC) 7.5 MG tablet Take 7.5 mg by mouth daily as needed for pain.      Menthol-Methyl Salicylate (MUSCLE RUB) 10-15 % CREA Apply 1 application topically as needed for muscle pain. CareALL muscle rub      naproxen (NAPROSYN) 500 MG tablet Take 500 mg by mouth 2 (two) times daily as needed for  moderate pain.      ondansetron (ZOFRAN) 4 MG tablet Take 1 tablet (4 mg total) by mouth daily as needed for nausea or vomiting. 20 tablet 1    rosuvastatin (CRESTOR) 5 MG tablet Take 5 mg by mouth every morning.      sertraline (ZOLOFT) 100 MG tablet Take 200 mg by mouth every morning.      Spacer/Aero-Holding Chambers (AEROCHAMBER MV) inhaler Use as instructed 1 each 2    tiZANidine (ZANAFLEX) 4 MG tablet Take 4 mg by mouth at bedtime as needed for muscle spasms.      traZODone (DESYREL) 50 MG tablet Take 25 mg by mouth at bedtime.      valACYclovir (VALTREX) 500 MG tablet Take 500 mg by mouth 2 (two) times daily as needed (fever blisters).      vitamin B-12 (CYANOCOBALAMIN) 1000 MCG tablet Take 1,000 mcg by mouth as needed.      vitamin E 180 MG (400 UNITS) capsule Take 400 Units by mouth as needed.        Allergies  Allergen Reactions   Carbidopa-Levodopa Nausea And Vomiting   Imitrex [Sumatriptan]     Face tingling    Sulfa Antibiotics    Penicillins Rash and Other (See  Comments)    Tolerated 1st generation cephalosporin (CEFAZOLIN) on 03/22/2015 without ADRs. 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)    Bowel trouble 1998   Cervicalgia 03/09/2014   Depression    Diffuse cystic mastopathy    Dyspnea    cannot walk a mile without getting sob   Family history of adverse reaction to anesthesia    cousin on dad's side died from malignant hyperthermia in the 1970's   Family history of malignant neoplasm of breast    Family history of malignant neoplasm of gastrointestinal tract    Fibrocystic breast changes    GERD (gastroesophageal reflux disease)    Gout 2004   pseudogout   Headache disorder    R sided, etiology unknown    HLD (hyperlipidemia)    Insomnia    Loss of memory    Lump or mass in breast 2012   Osteopenia     Parkinsonian features    PONV (postoperative nausea and vomiting)    Pseudogout    Sleep apnea    Urinary incontinence    Uterine prolapse    Vitamin D deficiency     Review of systems:  Otherwise negative.    Physical Exam  Gen: Alert, oriented. Appears stated age.  HEENT: PERRLA. Lungs: No respiratory distress CV: RRR Abd: soft, benign, no masses Ext: No edema    Planned procedures: Proceed with EGD. The patient understands the nature of the planned procedure, indications, risks, alternatives and potential complications including but not limited to bleeding, infection, perforation, damage to internal organs and possible oversedation/side effects from anesthesia. The patient agrees and gives consent to proceed.  Please refer to procedure notes for findings, recommendations and patient disposition/instructions.     Margaret Rubenstein, MD [facility] Gastroenterology

## 2022-06-02 NOTE — Interval H&P Note (Signed)
History and Physical Interval Note:  06/02/2022 12:42 PM  Margaret Roberson  has presented today for surgery, with the diagnosis of Melena.  The various methods of treatment have been discussed with the patient and family. After consideration of risks, benefits and other options for treatment, the patient has consented to  Procedure(s): ESOPHAGOGASTRODUODENOSCOPY (EGD) WITH PROPOFOL (N/A) as a surgical intervention.  The patient's history has been reviewed, patient examined, no change in status, stable for surgery.  I have reviewed the patient's chart and labs.  Questions were answered to the patient's satisfaction.     Lesly Rubenstein  Ok to proceed with EGD

## 2024-02-27 ENCOUNTER — Other Ambulatory Visit: Payer: Self-pay | Admitting: Student

## 2024-02-27 DIAGNOSIS — R55 Syncope and collapse: Secondary | ICD-10-CM

## 2024-03-04 ENCOUNTER — Ambulatory Visit
Admission: RE | Admit: 2024-03-04 | Discharge: 2024-03-04 | Disposition: A | Discharge status: Home / Self Care | Source: Ambulatory Visit | Attending: Student | Admitting: Student

## 2024-03-04 DIAGNOSIS — R55 Syncope and collapse: Secondary | ICD-10-CM

## 2024-03-21 ENCOUNTER — Other Ambulatory Visit: Payer: Self-pay | Admitting: Cardiology

## 2024-03-21 DIAGNOSIS — Z01818 Encounter for other preprocedural examination: Secondary | ICD-10-CM

## 2024-03-21 DIAGNOSIS — I429 Cardiomyopathy, unspecified: Secondary | ICD-10-CM

## 2024-03-21 DIAGNOSIS — Z8241 Family history of sudden cardiac death: Secondary | ICD-10-CM

## 2024-03-21 DIAGNOSIS — R55 Syncope and collapse: Secondary | ICD-10-CM

## 2024-03-21 DIAGNOSIS — Z8249 Family history of ischemic heart disease and other diseases of the circulatory system: Secondary | ICD-10-CM

## 2024-03-21 DIAGNOSIS — I7121 Aneurysm of the ascending aorta, without rupture: Secondary | ICD-10-CM

## 2024-03-26 ENCOUNTER — Ambulatory Visit
Admission: RE | Admit: 2024-03-26 | Discharge: 2024-03-26 | Disposition: A | Discharge status: Home / Self Care | Source: Ambulatory Visit | Attending: Cardiology | Admitting: Cardiology

## 2024-03-26 DIAGNOSIS — I7121 Aneurysm of the ascending aorta, without rupture: Secondary | ICD-10-CM | POA: Insufficient documentation

## 2024-03-26 DIAGNOSIS — Z8249 Family history of ischemic heart disease and other diseases of the circulatory system: Secondary | ICD-10-CM | POA: Diagnosis present

## 2024-03-26 DIAGNOSIS — Z01818 Encounter for other preprocedural examination: Secondary | ICD-10-CM | POA: Insufficient documentation

## 2024-04-11 ENCOUNTER — Encounter
Admission: RE | Admit: 2024-04-11 | Discharge: 2024-04-11 | Disposition: A | Discharge status: Home / Self Care | Source: Ambulatory Visit | Attending: Cardiology | Admitting: Cardiology

## 2024-04-11 DIAGNOSIS — Z8241 Family history of sudden cardiac death: Secondary | ICD-10-CM | POA: Diagnosis present

## 2024-04-11 DIAGNOSIS — R55 Syncope and collapse: Secondary | ICD-10-CM | POA: Insufficient documentation

## 2024-04-11 DIAGNOSIS — I429 Cardiomyopathy, unspecified: Secondary | ICD-10-CM | POA: Diagnosis present

## 2024-04-11 LAB — NM MYOCAR MULTI W/SPECT W/WALL MOTION / EF
Base ST Depression (mm): 0 mm
Estimated workload: 1
Exercise duration (min): 1 min
Exercise duration (sec): 0 s
LV dias vol: 73 mL (ref 46–106)
LV sys vol: 32 mL
MPHR: 154 {beats}/min
Nuc Stress EF: 56 %
Peak HR: 121 {beats}/min
Percent HR: 78 %
Rest HR: 64 {beats}/min
Rest Nuclear Isotope Dose: 11 mCi
SDS: 1
SRS: 0
SSS: 2
ST Depression (mm): 0 mm
Stress Nuclear Isotope Dose: 31.4 mCi
TID: 1

## 2024-04-11 MED ORDER — TECHNETIUM TC 99M TETROFOSMIN IV KIT
10.0000 | PACK | Freq: Once | INTRAVENOUS | Status: AC | PRN
  Administered 2024-04-11: 11.01 via INTRAVENOUS

## 2024-04-11 MED ORDER — TECHNETIUM TC 99M TETROFOSMIN IV KIT
31.4300 | PACK | Freq: Once | INTRAVENOUS | Status: AC | PRN
  Administered 2024-04-11: 31.43 via INTRAVENOUS

## 2024-04-11 MED ORDER — REGADENOSON 0.4 MG/5ML IV SOLN
0.4000 mg | Freq: Once | INTRAVENOUS | Status: AC
  Administered 2024-04-11: 0.4 mg via INTRAVENOUS

## 2024-09-09 ENCOUNTER — Other Ambulatory Visit: Payer: Self-pay | Admitting: General Surgery

## 2024-09-09 DIAGNOSIS — N6459 Other signs and symptoms in breast: Secondary | ICD-10-CM

## 2024-09-16 ENCOUNTER — Encounter

## 2024-09-16 ENCOUNTER — Other Ambulatory Visit

## 2024-09-25 ENCOUNTER — Other Ambulatory Visit: Payer: Self-pay | Admitting: Nurse Practitioner

## 2024-09-25 ENCOUNTER — Telehealth (HOSPITAL_COMMUNITY): Payer: Self-pay | Admitting: Emergency Medicine

## 2024-09-25 DIAGNOSIS — R5383 Other fatigue: Secondary | ICD-10-CM

## 2024-09-25 DIAGNOSIS — Z8241 Family history of sudden cardiac death: Secondary | ICD-10-CM

## 2024-09-25 DIAGNOSIS — Z8249 Family history of ischemic heart disease and other diseases of the circulatory system: Secondary | ICD-10-CM

## 2024-09-25 DIAGNOSIS — R079 Chest pain, unspecified: Secondary | ICD-10-CM

## 2024-09-25 NOTE — Telephone Encounter (Signed)
 Attempted to call patient regarding upcoming cardiac CT appointment. Left message on voicemail with name and callback number Rockwell Alexandria RN Navigator Cardiac Imaging [facility] [phone] Office [phone] Cell

## 2024-09-25 NOTE — Progress Notes (Signed)
 Established Patient Visit   Chief Complaint: Chief Complaint  Patient presents with  . Follow-up    Chest pain- had an episode last night, had trouble breathing, took 4 baby aspirin, broke out in a cold sweat, hands were tingling, dry heaves, nauseated     Date of Service: 09/25/2024 Date of Birth: 11-08-1956 PCP: Jeffie Cheryl Therman Mickey., MD [address] [facility] [place] [id number]  History of Present Illness:   [name] is a 68 y.o.female patient that presents for scheduled visit due to chest pain.   PMH: HLD Syncope Family history of SCD  Family CVD History: Sudden cardiac death in her mother at age of 43. Also has history of sudden cardiac death in paternal uncles. Her older brother with history of aortic dissection at age of 59. 1 of cousin on paternal side had sudden cardiac death while inducing anesthesia at age of 19.   Last seen by Dr. Wilburn 04/23/24.    Presents today due to chest pain episode yesterday. Was sitting in her bathroom yesterday afternoon holding her cat and reported acute onset of chest pain that progressed in intensity. She took 4 aspirins. Bilateral hands went numb, dyspneic, diaphoresis and nausea. Lasted 20 minutes and then resolved. Symptoms have not recurred by she reports central chest soreness. She reports no prior h/o MI or symptoms like this. She additionally reports upper mid back pain between shoulder blade in the last month. Feeling fatigued with functional decline in the last month as well. Denies syncope in last 90 days. Last episode April or May 2025. She additionally reports opening her eyes at night and having vision changes, seeing dots. Only notes after waking up from being asleep. Noted in last 1-2 months. MRA Neck 03/04/24 without stenosis of bilateral ICAs. MRA Head 03/04/24 revealed no high-grade stenosis or occlusion of intracranial vasculature. No vascular malformation or aneurysm identified. She has had a normal dilated eye exam with her  ophthalmologist since onset of these symptoms. Vertebral arteries normal in course and caliber. EKG today reveals NSR, flattended T waves throughout, inverted in V1-V2, HR 65 bpm.   Patient has history of passing out for at least last 10 years. Has about 3-4 episodes in a year. In last year, had about 6-8 episodes. Usual episode mostly happen in middle of night, few times happen in the morning. After she gets up and walks, then she passes out and wakes up on the floor. A lot of times after waking up she cannot move for 10 to 15 minutes and then can get up and walk. No prodromal symptoms. She had 19 day event monitor placed 02/20/24 with 12 days of diagnostic time. Predominant sinus rhythm without significant arrhythmia. She had 2D ECHO 03/10/24 with no LVH, EF 50%, no significant valvular disease. PET CT stress test 03/2024 with no ischemia, normal LV function. CT chest/2025 with no aortic aneurysm, no cardiomegaly, mild atherosclerosis.   Past Medical and Surgical History  Past Medical History Past Medical History:  Diagnosis Date  . Abnormal menstrual cycle 03/09/2014  . Cervicalgia 03/09/2014  . Fibrocystic breast changes   . Headache 03/09/2014  . Hyperlipidemia   . Migraine headache 03/09/2014  . OSA (obstructive sleep apnea) 03/09/2014  . Osteopenia   . Tremor   . Vitamin D deficiency 03/09/2014    Past Surgical History She has a past surgical history that includes Cesarean section; hysterectomy vaginal (N/A, 03/22/2015); colpopexy for suspension uterosacrum intraperitoneal (N/A, 03/22/2015); sling for stress incontinence (N/A, 03/22/2015); cystourethroscopy (  N/A, 03/22/2015); colporrhaphy for repair cystocele anterior (03/22/2015); Colonoscopy (01/08/2014 ([facility])); egd (10/04/2016); Colonoscopy (05/06/2021); egd (05/06/2021); cholecystectomy (11/11/2021); Cholecystectomy; and EGD @ [facility] (06/02/2022).   Medications and Allergies  Current Medications  Current Outpatient Medications on File Prior  to Visit  Medication Sig Dispense Refill  . acetaminophen  (TYLENOL ) 500 MG tablet Take by mouth.    . albuterol  (PROVENTIL ) 2.5 mg /3 mL (0.083 %) nebulizer solution Take 3 mLs (2.5 mg total) by nebulization every 4 (four) hours as needed for Wheezing or Shortness of Breath for up to 7 days 300 mL 0  . albuterol  90 mcg/actuation inhaler Inhale 2 inhalations into the lungs every 4 (four) hours as needed for Wheezing or Shortness of Breath 1 each 1  . alendronate (FOSAMAX) 70 MG tablet Take 1 tablet (70 mg total) by mouth every 7 (seven) days Take on an empty stomach with a full glass of water. Avoid mineral or well water. Do not eat or take other medications for at least 30 minutes after dose. Sit or stand for at least 30 minutes after dose. 13 tablet 3  . baclofen (LIORESAL) 10 MG tablet TAKE (1) TABLET BY MOUTH TWICE DAILY 180 tablet 1  . calcium carbonate-vitamin D3 (CALTRATE 600+D) 600 mg-5 mcg (200 unit) tablet Take 1 tablet by mouth 2 (two) times daily with meals    . cholecalciferol (VITAMIN D3) 1,000 unit tablet Take 2,000 Units by mouth once daily    . cyanocobalamin (VITAMIN B12) 1000 MCG tablet Take 1,000 mcg by mouth once daily    . magnesium gluconate (MAGONATE) 27.5 mg magne- sium (500 mg) tablet Take 500 mg by mouth every morning before breakfast    . pantoprazole (PROTONIX) 40 MG DR tablet Take 1 tablet (40 mg total) by mouth 2 (two) times daily 180 tablet 3  . POTASSIUM ORAL Take 99 mg by mouth    . rizatriptan (MAXALT) 10 MG tablet Take 10 mg by mouth as needed    . rosuvastatin (CRESTOR) 5 MG tablet Take 1 tablet (5 mg total) by mouth once daily 90 tablet 3  . sertraline (ZOLOFT) 100 MG tablet Take 200 mg by mouth once daily       . traZODone (DESYREL) 50 MG tablet     . valACYclovir (VALTREX) 500 MG tablet Take 1 tablet (500 mg total) by mouth 2 (two) times daily as needed 18 tablet 1  . vitamin B complex (B COMPLEX ORAL) Take by mouth    . vitamin E 400 UNIT capsule Take 268 mg  by mouth once daily     No current facility-administered medications on file prior to visit.    Allergies: Carbidopa-levodopa, Imitrex [sumatriptan succinate], Penicillins, Sulfa (sulfonamide antibiotics), and Sulfasalazine  Social and Family History  Social History  reports that she has never smoked. She has never used smokeless tobacco. She reports that she does not drink alcohol and does not use drugs.  Family History Family History  Problem Relation Name Age of Onset  . Myocardial Infarction (Heart attack) Mother    . High blood pressure (Hypertension) Mother    . Stroke Mother    . Breast cancer Sister    . Heart disease Brother    . Colon cancer Brother    . High blood pressure (Hypertension) Brother    . Anesthesia problems Cousin  19       died in surgery for pin in elbow 1960's,no other details,possibly MH    Review of Systems   Review  of Systems  Eyes:        Vision changes as per HPI  Respiratory:  Negative for shortness of breath.   Cardiovascular:  Negative for chest pain and leg swelling.  Musculoskeletal:  Positive for back pain.  Neurological:  Negative for dizziness and loss of consciousness.  Endo/Heme/Allergies:  Does not bruise/bleed easily.      Physical Examination   Vitals:BP 138/76   Pulse 75   Ht 154.9 cm (5' 1)   Wt 67.6 kg (149 lb)   LMP 03/16/2015 (LMP Unknown)   SpO2 98%   BMI 28.15 kg/m  Ht:154.9 cm (5' 1) Wt:67.6 kg (149 lb) ADJ:Anib surface area is 1.71 meters squared. Body mass index is 28.15 kg/m.  Physical Exam Vitals reviewed.  Constitutional:      General: She is not in acute distress.    Appearance: Normal appearance.  Neck:     Vascular: No carotid bruit.  Cardiovascular:     Rate and Rhythm: Normal rate and regular rhythm.     Pulses:          Radial pulses are 2+ on the right side and 2+ on the left side.     Heart sounds: Normal heart sounds. No murmur heard. Pulmonary:     Effort: Pulmonary effort is  normal. No respiratory distress.     Breath sounds: Normal breath sounds.  Musculoskeletal:     Right lower leg: No edema.     Left lower leg: No edema.  Neurological:     Mental Status: She is alert.       Data & Results   Recent Labs    07/17/23 0854 02/26/24 1520 08/27/24 1153  CHOLTOTAL 180 180 181  HDL 58.2 53.3 55.6  LDLCALC 96 98 102  VLDL 26 28 24   TRIG 129 142 119    Recent Labs    07/17/23 0854 02/26/24 1520 08/27/24 1153  NA 143 141 141  K 4.1 4.8 4.2  BUN 20 15 20   CREATININE 0.8 0.7 0.6  CO2 30.0 33.0* 31.9  GLUCOSE 90 96 83  ALT 10 10 9   AST 11 14 14   TBILI 0.5 0.4 0.4  ALB 4.4 4.6 4.5    Recent Labs    08/16/23 1102 02/26/24 1520 08/27/24 1153  WBC 4.9 5.5 5.1  HGB 11.8* 12.0 12.0  HCT 37.5 37.8 37.4  MCV 98.4 99.5 98.2  PLT 240 211 235    Recent Labs    04/28/22 1703 08/11/22 1030 07/17/23 0854 02/26/24 1520 08/27/24 1153  TSH 1.963  --  2.768  --   --   HGBA1C  --    < > 6.0* 5.9* 6.1*   < > = values in this interval not displayed.        Assessment   68 y.o. female with  Encounter Diagnoses  Name Primary?  . Chest pain with high risk for cardiac etiology Yes  . Other fatigue   . Family history of sudden cardiac death (SCD)   . Family history of aortic dissection   . Pure hypercholesterolemia   . Syncope, unspecified syncope type     Plan   Orders Placed This Encounter  Procedures  . CT heart angiogram  . ECG 12-lead   Chest pain resolved after 20 minutes yesterday and has not recurred. EKG reveals NSR without acute ischemic changes concerning for ACS. Hemodynamically stable. Asymptomatic today in clinic.   - STAT CCTA. HR < 70 on review of recent  EKGs, no prior treatment with metoprolol recommended. - ED precautions discussed for any recurrence of chest pain.   Will call pt with CCTA results.  Follow up with Dr. Wilburn as scheduled.  ED with any recurrent chest pain  Attestation Statement:   I  personally performed the service, non-incident to. [facility])   MARY TINNIE LAUNIE MAIDEN, NP

## 2024-09-29 ENCOUNTER — Ambulatory Visit
Admission: RE | Admit: 2024-09-29 | Discharge: 2024-09-29 | Disposition: A | Discharge status: Home / Self Care | Source: Ambulatory Visit | Attending: Nurse Practitioner | Admitting: Nurse Practitioner

## 2024-09-29 DIAGNOSIS — Z8249 Family history of ischemic heart disease and other diseases of the circulatory system: Secondary | ICD-10-CM | POA: Diagnosis not present

## 2024-09-29 DIAGNOSIS — I2510 Atherosclerotic heart disease of native coronary artery without angina pectoris: Secondary | ICD-10-CM | POA: Insufficient documentation

## 2024-09-29 DIAGNOSIS — R079 Chest pain, unspecified: Secondary | ICD-10-CM | POA: Diagnosis present

## 2024-09-29 DIAGNOSIS — Z8241 Family history of sudden cardiac death: Secondary | ICD-10-CM | POA: Insufficient documentation

## 2024-09-29 DIAGNOSIS — R5383 Other fatigue: Secondary | ICD-10-CM | POA: Insufficient documentation

## 2024-09-29 MED ORDER — METOPROLOL TARTRATE 5 MG/5ML IV SOLN: 10.0000 mg | INTRAVENOUS | Status: DC | PRN

## 2024-09-29 MED ORDER — DILTIAZEM HCL 25 MG/5ML IV SOLN: 10.0000 mg | INTRAVENOUS | Status: DC | PRN

## 2024-09-29 MED ORDER — NITROGLYCERIN 0.4 MG SL SUBL
0.8000 mg | SUBLINGUAL_TABLET | Freq: Once | SUBLINGUAL | Status: AC
  Administered 2024-09-29: 0.8 mg via SUBLINGUAL

## 2024-09-29 MED ORDER — METOPROLOL TARTRATE 5 MG/5ML IV SOLN
INTRAVENOUS | Status: AC
  Filled 2024-09-29: qty 10

## 2024-09-29 MED ORDER — IOHEXOL 350 MG/ML SOLN
100.0000 mL | Freq: Once | INTRAVENOUS | Status: AC | PRN
  Administered 2024-09-29: 100 mL via INTRAVENOUS

## 2024-10-13 ENCOUNTER — Inpatient Hospital Stay: Admission: RE | Admit: 2024-10-13 | Source: Ambulatory Visit

## 2024-10-22 ENCOUNTER — Ambulatory Visit: Payer: Self-pay | Admitting: General Surgery

## 2024-10-29 ENCOUNTER — Other Ambulatory Visit: Payer: Self-pay

## 2024-10-29 ENCOUNTER — Inpatient Hospital Stay
Admission: RE | Admit: 2024-10-29 | Discharge: 2024-10-29 | Disposition: A | Source: Ambulatory Visit | Attending: General Surgery

## 2024-10-29 HISTORY — DX: Non-celiac gluten sensitivity: K90.41

## 2024-10-29 HISTORY — DX: Unilateral inguinal hernia, without obstruction or gangrene, not specified as recurrent: K40.90

## 2024-10-29 NOTE — Patient Instructions (Addendum)
 Your procedure is scheduled on: [facility] 11/05/24  Report to the Registration Desk on the 1st floor of the [facility] Mall. To find out your arrival time, please call [phone] between 1PM - 3PM on: TUESDAY 11/04/24 If your arrival time is 6:00 am, do not arrive before that time as the Medical Mall entrance doors do not open until 6:00 am.  REMEMBER: Instructions that are not followed completely may result in serious medical risk, up to and including death; or upon the discretion of your surgeon and anesthesiologist your surgery may need to be rescheduled.  Do not eat food after midnight the night before surgery.  No gum chewing or hard candies.  One week prior to surgery: Stop Anti-inflammatories (NSAIDS) such as baclofen (LIORESAL) Advil, Aleve, Ibuprofen, Motrin, Naproxen, Naprosyn and Aspirin based products such as Excedrin, Goody's Powder, BC Powder.  Stop ANY OVER THE COUNTER supplements until after surgery.   You may however, continue to take Tylenol  if needed for pain up until the day of surgery.  Continue taking all of your other prescription medications up until the day of surgery.  ON THE DAY OF SURGERY ONLY TAKE THESE MEDICATIONS WITH SIPS OF WATER:  acetaminophen  (TYLENOL ) * IF NEEDED  albuterol  (VENTOLIN  HFA)  pantoprazole (PROTONIX)  rosuvastatin (CRESTOR)  sertraline (ZOLOFT)   Use inhalers on the day of surgery and bring to the hospital.  No Alcohol for 24 hours before or after surgery.  No Smoking including e-cigarettes for 24 hours before surgery.  No chewable tobacco products for at least 6 hours before surgery.  No nicotine patches on the day of surgery.  Do not use any recreational drugs for at least a week (preferably 2 weeks) before your surgery.  Please be advised that the combination of cocaine and anesthesia may have negative outcomes, up to and including death. If you test positive for cocaine, your surgery will be cancelled.  On the morning of  surgery brush your teeth with toothpaste and water, you may rinse your mouth with mouthwash if you wish. Do not swallow any toothpaste or mouthwash.  Use CHG Soap or wipes as directed on instruction sheet.  Do not wear jewelry, make-up, hairpins, clips or nail polish.  For welded (permanent) jewelry: bracelets, anklets, waist bands, etc.  Please have this removed prior to surgery.  If it is not removed, there is a chance that hospital personnel will need to cut it off on the day of surgery.  Do not wear lotions, powders, or perfumes.   Do not shave body hair from the neck down 48 hours before surgery.  Contact lenses, hearing aids and dentures may not be worn into surgery.  Do not bring valuables to the hospital. [facility] is not responsible for any missing/lost belongings or valuables.   Bring your C-PAP to the hospital in case you may have to spend the night.   Notify your doctor if there is any change in your medical condition (cold, fever, infection).  Wear comfortable clothing (specific to your surgery type) to the hospital.  After surgery, you can help prevent lung complications by doing breathing exercises.  Take deep breaths and cough every 1-2 hours. Your doctor may order a device called an Incentive Spirometer to help you take deep breaths. When coughing or sneezing, hold a pillow firmly against your incision with both hands. This is called "splinting." Doing this helps protect your incision. It also decreases belly discomfort.  If you are being discharged the day of surgery,  you will not be allowed to drive home. You will need a responsible individual to drive you home and stay with you for 24 hours after surgery.   If you are taking public transportation, you will need to have a responsible individual with you.  Please call the Pre-admissions Testing Dept. at [phone] if you have any questions about these instructions.   Surgery Visitation Policy:  Patients  having surgery or a procedure may have two visitors.  Children under the age of 63 must have an adult with them who is not the patient.  Merchandiser, Retail to address health-related social needs:  [email or web address]                                                                                                             Preparing for Surgery with CHLORHEXIDINE  GLUCONATE (CHG) Soap  Chlorhexidine  Gluconate (CHG) Soap  o An antiseptic cleaner that kills germs and bonds with the skin to continue killing germs even after washing  o Used for showering the night before surgery and morning of surgery  Before surgery, you can play an important role by reducing the number of germs on your skin.  CHG (Chlorhexidine  gluconate) soap is an antiseptic cleanser which kills germs and bonds with the skin to continue killing germs even after washing.  Please do not use if you have an allergy to CHG or antibacterial soaps. If your skin becomes reddened/irritated stop using the CHG.  1. Shower the NIGHT BEFORE SURGERY with CHG soap.  2. If you choose to wash your hair, wash your hair first as usual with your normal shampoo.  3. After shampooing, rinse your hair and body thoroughly to remove the shampoo.  4. Use CHG as you would any other liquid soap. You can apply CHG directly to the skin and wash gently with a clean washcloth.  5. Apply the CHG soap to your body only from the neck down. Do not use on open wounds or open sores. Avoid contact with your eyes, ears, mouth, and genitals (private parts). Wash face and genitals (private parts) with your normal soap.  6. Wash thoroughly, paying special attention to the area where your surgery will be performed.  7. Thoroughly rinse your body with warm water.  8. Do not shower/wash with your normal soap after using and rinsing off the CHG soap.  9. Do not use lotions, oils, etc., after showering with CHG.  10. Pat yourself dry  with a clean towel.  11. Wear clean pajamas to bed the night before surgery.  12. Place clean sheets on your bed the night of your shower and do not sleep with pets.  13. Do not apply any deodorants/lotions/powders.  14. Please wear clean clothes to the hospital.  15. Remember to brush your teeth with your regular toothpaste.

## 2024-10-30 NOTE — H&P (View-Only) (Signed)
 History of Present Illness Margaret Roberson is a 68 year old female who presents for preoperative evaluation for right inguinal hernia repair.  She has experienced symptoms of a right inguinal hernia, including intermittent groin pain and a noticeable bulge in the groin area, for at least a year. The pain is persistent, but no new symptoms have developed.  She has a history of syncope episodes and underwent evaluation by cardiology. A heart CT scan was conducted in October 2025.  She has gluten intolerance, which poses challenges, particularly when dining out or attending social gatherings. She follows a natural diet, focusing on foods like strawberries.  She has a history of muscle weakness and nerve issues, which have been previously evaluated. She experienced a prolapse several years ago and underwent various tests that indicated very weak muscles. Despite a bladder sling surgery, she experiences incontinence.  She is expecting her second grandchild and is considering traveling to [place]  after the surgery, depending on her recovery.      PAST MEDICAL HISTORY:  Past Medical History:  Diagnosis Date   Abnormal menstrual cycle 03/09/2014   Cervicalgia 03/09/2014   Fibrocystic breast changes    Headache 03/09/2014   Hyperlipidemia    Migraine headache 03/09/2014   OSA (obstructive sleep apnea) 03/09/2014   Osteopenia    Tremor    Vitamin D deficiency 03/09/2014        PAST SURGICAL HISTORY:   Past Surgical History:  Procedure Laterality Date   HYSTERECTOMY VAGINAL N/A 03/22/2015   Procedure: (RCC) HYSTERECTOMY VAGINAL;  Surgeon: Dorthea Velia Reek, MD;  Location: [facility];  Service: Gynecology;  Laterality: N/A;   COLPOPEXY FOR SUSPENSION UTEROSACRUM INTRAPERITONEAL N/A 03/22/2015   Procedure: COLPOPEXY FOR SUSPENSION UTEROSACRUM INTRAPERITONEAL;  Surgeon: Dorthea Velia Reek, MD;  Location: [facility];  Service: Gynecology;  Laterality: N/A;   SLING FOR STRESS INCONTINENCE  N/A 03/22/2015   Procedure: VALORIE FOR STRESS INCONTINENCE;  Surgeon: Dorthea Velia Reek, MD;  Location: [facility];  Service: Gynecology;  Laterality: N/A;   CYSTOURETHROSCOPY N/A 03/22/2015   Procedure: CYSTOURETHROSCOPY;  Surgeon: Dorthea Velia Reek, MD;  Location: [facility];  Service: Gynecology;  Laterality: N/A;   COLPORRHAPHY FOR REPAIR CYSTOCELE ANTERIOR  03/22/2015   Procedure: COLPORRHAPHY FOR REPAIR CYSTOCELE ANTERIOR;  Surgeon: Dorthea Velia Reek, MD;  Location: [facility];  Service: Gynecology;;   EGD  10/04/2016   Gastritis: No repeat per RTE   COLONOSCOPY  05/06/2021   Tubular adenoma/FHx CC/Repeat 2yrs/CTL   EGD  05/06/2021   Normal EGD biopsies/Repeat as needed/CTL   CHOLECYSTECTOMY  11/11/2021   Dr Lucas Catchings -- ROBOTIC   EGD @ [facility]  06/02/2022   Normal examined EGD/Repeat as needed/CTL   CESAREAN SECTION     CHOLECYSTECTOMY     COLONOSCOPY  01/08/2014 [facility])   [facility] ([facility]): CBF 12/2018 Recall ltr mailed          MEDICATIONS:  Outpatient Encounter Medications as of 10/30/2024  Medication Sig Dispense Refill   acetaminophen  (TYLENOL ) 500 MG tablet Take by mouth.     albuterol  (PROVENTIL ) 2.5 mg /3 mL (0.083 %) nebulizer solution Take 3 mLs (2.5 mg total) by nebulization every 4 (four) hours as needed for Wheezing or Shortness of Breath for up to 7 days 300 mL 0   albuterol  90 mcg/actuation inhaler Inhale 2 inhalations into the lungs every 4 (four) hours as needed for Wheezing or Shortness of Breath 1 each 1   alendronate (FOSAMAX) 70 MG tablet Take 1 tablet (70 mg total)  by mouth every 7 (seven) days Take on an empty stomach with a full glass of water. Avoid mineral or well water. Do not eat or take other medications for at least 30 minutes after dose. Sit or stand for at least 30 minutes after dose. 13 tablet 3   aspirin 81 MG chewable tablet Take 81 mg by mouth     baclofen (LIORESAL) 10 MG tablet TAKE (1) TABLET BY MOUTH TWICE DAILY 180 tablet 1    calcium carbonate-vitamin D3 (CALTRATE 600+D) 600 mg-5 mcg (200 unit) tablet Take 1 tablet by mouth 2 (two) times daily with meals     cholecalciferol (VITAMIN D3) 1,000 unit tablet Take 2,000 Units by mouth once daily     cyanocobalamin (VITAMIN B12) 1000 MCG tablet Take 1,000 mcg by mouth once daily     magnesium gluconate (MAGONATE) 27.5 mg magne- sium (500 mg) tablet Take 500 mg by mouth every morning before breakfast     pantoprazole (PROTONIX) 40 MG DR tablet Take 1 tablet (40 mg total) by mouth 2 (two) times daily 180 tablet 3   potassium gluconate 2.5 mEq Tab Take 99 mg by mouth     POTASSIUM ORAL Take 99 mg by mouth     rizatriptan (MAXALT) 10 MG tablet Take 10 mg by mouth as needed     rosuvastatin (CRESTOR) 5 MG tablet Take 1 tablet (5 mg total) by mouth once daily 90 tablet 3   sertraline (ZOLOFT) 100 MG tablet Take 200 mg by mouth once daily        traZODone (DESYREL) 50 MG tablet      valACYclovir (VALTREX) 500 MG tablet Take 1 tablet (500 mg total) by mouth 2 (two) times daily as needed 18 tablet 1   vitamin B complex (B COMPLEX ORAL) Take by mouth     vitamin E 400 UNIT capsule Take 268 mg by mouth once daily     No facility-administered encounter medications on file as of 10/30/2024.     ALLERGIES:   Carbidopa-levodopa, Imitrex [sumatriptan succinate], Penicillins, Sulfa (sulfonamide antibiotics), and Sulfasalazine   SOCIAL HISTORY:  Social History   Socioeconomic History   Marital status: Married  Tobacco Use   Smoking status: Never   Smokeless tobacco: Never  Vaping Use   Vaping status: Never Used  Substance and Sexual Activity   Alcohol use: No   Drug use: No   Sexual activity: Not Currently    Birth control/protection: Surgical   Social Drivers of Health   Financial Resource Strain: Low Risk  (08/27/2024)   Overall Financial Resource Strain (CARDIA)    Difficulty of Paying Living Expenses: Not hard at all  Food Insecurity: No Food  Insecurity (08/27/2024)   Hunger Vital Sign    Worried About Running Out of Food in the Last Year: Never true    Ran Out of Food in the Last Year: Never true  Transportation Needs: No Transportation Needs (08/27/2024)   PRAPARE - Administrator, Civil Service (Medical): No    Lack of Transportation (Non-Medical): No    FAMILY HISTORY:  Family History  Problem Relation Name Age of Onset   Myocardial Infarction (Heart attack) Mother     High blood pressure (Hypertension) Mother     Stroke Mother     Breast cancer Sister     Heart disease Brother     Colon cancer Brother     High blood pressure (Hypertension) Brother     Anesthesia problems Cousin  19       died in surgery for pin in elbow 1960's,no other details,possibly MH     GENERAL REVIEW OF SYSTEMS:   General ROS: negative for - chills, fatigue, fever, weight gain or weight loss Allergy and Immunology ROS: negative for - hives  Hematological and Lymphatic ROS: negative for - bleeding problems or bruising, negative for palpable nodes Endocrine ROS: negative for - heat or cold intolerance, hair changes Respiratory ROS: negative for - cough, shortness of breath or wheezing Cardiovascular ROS: no chest pain or palpitations GI ROS: negative for nausea, vomiting, abdominal pain, diarrhea, constipation Musculoskeletal ROS: negative for - joint swelling or muscle pain Neurological ROS: negative for - confusion, syncope Dermatological ROS: negative for pruritus and rash  PHYSICAL EXAM:  Vitals:   10/30/24 0837  BP: 111/66  Pulse: 72  .  Ht:154.9 cm (5' 1) Wt:68.9 kg (152 lb) ADJ:Anib surface area is 1.72 meters squared. Body mass index is 28.72 kg/m.SABRA   GENERAL: Alert, active, oriented x3  HEENT: Pupils equal reactive to light. Extraocular movements are intact. Sclera clear. Palpebral conjunctiva normal red color.Pharynx clear.  NECK: Supple with no palpable mass and no adenopathy.  LUNGS: Sound  clear with no rales rhonchi or wheezes.  HEART: Regular rhythm S1 and S2 without murmur.  ABDOMEN: Soft and depressible, nontender with no palpable mass, no hepatomegaly.  Palpable right inguinal hernia  EXTREMITIES: Well-developed well-nourished symmetrical with no dependent edema.  NEUROLOGICAL: Awake alert oriented, facial expression symmetrical, moving all extremities.   Results RADIOLOGY Heart CT scan: No evidence of coronary artery disease (08/2024)    Assessment & Plan Right inguinal hernia with pain   Chronic right inguinal hernia with intermittent pain for at least a year. Scheduled for laparoscopic repair on December 10th, 2025. Potential for left-sided hernia repair if found during surgery. Risks include bleeding, infection, and chronic pain. Benefits include prevention of future symptoms. Most recover fully in two weeks. Bruising or swelling may occur post-surgery; use ice packs for management. Informed about potential urinary retention and possible need for catheterization. Driving restrictions will depend on recovery status. Prescribed pain medication post-surgery.   Non-recurrent unilateral inguinal hernia without obstruction or gangrene [K40.90]          Patient verbalized understanding, all questions were answered, and were agreeable with the plan outlined above.   [name], MD  Electronically signed by [name], MD

## 2024-10-31 ENCOUNTER — Encounter

## 2024-10-31 ENCOUNTER — Inpatient Hospital Stay: Admission: RE | Admit: 2024-10-31 | Source: Ambulatory Visit

## 2024-11-04 ENCOUNTER — Other Ambulatory Visit: Payer: Self-pay | Admitting: General Surgery

## 2024-11-04 ENCOUNTER — Inpatient Hospital Stay: Admission: RE | Admit: 2024-11-04 | Discharge: 2024-11-04 | Attending: General Surgery

## 2024-11-04 ENCOUNTER — Ambulatory Visit
Admission: RE | Admit: 2024-11-04 | Discharge: 2024-11-04 | Disposition: A | Source: Ambulatory Visit | Attending: General Surgery

## 2024-11-04 DIAGNOSIS — R928 Other abnormal and inconclusive findings on diagnostic imaging of breast: Secondary | ICD-10-CM

## 2024-11-04 DIAGNOSIS — N6459 Other signs and symptoms in breast: Secondary | ICD-10-CM

## 2024-11-04 DIAGNOSIS — N63 Unspecified lump in unspecified breast: Secondary | ICD-10-CM

## 2024-11-05 ENCOUNTER — Other Ambulatory Visit: Payer: Self-pay

## 2024-11-05 ENCOUNTER — Encounter: Payer: Self-pay | Admitting: General Surgery

## 2024-11-05 ENCOUNTER — Encounter: Payer: Self-pay | Admitting: Urgent Care

## 2024-11-05 ENCOUNTER — Encounter: Admission: RE | Disposition: A | Payer: Self-pay | Source: Home / Self Care | Attending: General Surgery

## 2024-11-05 ENCOUNTER — Ambulatory Visit
Admission: RE | Admit: 2024-11-05 | Discharge: 2024-11-05 | Disposition: A | Attending: General Surgery | Admitting: General Surgery

## 2024-11-05 ENCOUNTER — Ambulatory Visit: Payer: Self-pay | Admitting: Urgent Care

## 2024-11-05 DIAGNOSIS — G4733 Obstructive sleep apnea (adult) (pediatric): Secondary | ICD-10-CM | POA: Diagnosis not present

## 2024-11-05 DIAGNOSIS — K219 Gastro-esophageal reflux disease without esophagitis: Secondary | ICD-10-CM | POA: Diagnosis not present

## 2024-11-05 DIAGNOSIS — D175 Benign lipomatous neoplasm of intra-abdominal organs: Secondary | ICD-10-CM | POA: Insufficient documentation

## 2024-11-05 DIAGNOSIS — K9041 Non-celiac gluten sensitivity: Secondary | ICD-10-CM | POA: Diagnosis not present

## 2024-11-05 DIAGNOSIS — M6281 Muscle weakness (generalized): Secondary | ICD-10-CM | POA: Diagnosis not present

## 2024-11-05 DIAGNOSIS — R32 Unspecified urinary incontinence: Secondary | ICD-10-CM | POA: Insufficient documentation

## 2024-11-05 DIAGNOSIS — K4090 Unilateral inguinal hernia, without obstruction or gangrene, not specified as recurrent: Secondary | ICD-10-CM | POA: Diagnosis present

## 2024-11-05 DIAGNOSIS — G43909 Migraine, unspecified, not intractable, without status migrainosus: Secondary | ICD-10-CM | POA: Diagnosis not present

## 2024-11-05 DIAGNOSIS — R55 Syncope and collapse: Secondary | ICD-10-CM | POA: Insufficient documentation

## 2024-11-05 HISTORY — PX: XI ROBOTIC ASSISTED INGUINAL HERNIA REPAIR WITH MESH: SHX6706

## 2024-11-05 SURGERY — REPAIR, HERNIA, INGUINAL, ROBOT-ASSISTED, LAPAROSCOPIC, USING MESH
Anesthesia: General | Site: Inguinal | Laterality: Right

## 2024-11-05 MED ORDER — LACTATED RINGERS IV SOLN: INTRAVENOUS | Status: DC | PRN

## 2024-11-05 MED ORDER — ACETAMINOPHEN 10 MG/ML IV SOLN
INTRAVENOUS | Status: AC
  Filled 2024-11-05: qty 100

## 2024-11-05 MED ORDER — LIDOCAINE HCL (PF) 2 % IJ SOLN
INTRAMUSCULAR | Status: AC
  Filled 2024-11-05: qty 5

## 2024-11-05 MED ORDER — MIDAZOLAM HCL (PF) 2 MG/2ML IJ SOLN
INTRAMUSCULAR | Status: DC | PRN
  Administered 2024-11-05 (×2): 1 mg via INTRAVENOUS

## 2024-11-05 MED ORDER — BUPIVACAINE-EPINEPHRINE (PF) 0.25% -1:200000 IJ SOLN
INTRAMUSCULAR | Status: AC
  Filled 2024-11-05: qty 30

## 2024-11-05 MED ORDER — PROPOFOL 1000 MG/100ML IV EMUL
INTRAVENOUS | Status: AC
  Filled 2024-11-05: qty 100

## 2024-11-05 MED ORDER — CEFAZOLIN SODIUM-DEXTROSE 2-4 GM/100ML-% IV SOLN
2.0000 g | INTRAVENOUS | Status: AC
  Administered 2024-11-05: 2 g via INTRAVENOUS

## 2024-11-05 MED ORDER — LIDOCAINE HCL (CARDIAC) PF 100 MG/5ML IV SOSY
PREFILLED_SYRINGE | INTRAVENOUS | Status: DC | PRN
  Administered 2024-11-05: 50 mg via INTRAVENOUS

## 2024-11-05 MED ORDER — ROCURONIUM BROMIDE 10 MG/ML (PF) SYRINGE
PREFILLED_SYRINGE | INTRAVENOUS | Status: AC
  Filled 2024-11-05: qty 10

## 2024-11-05 MED ORDER — CEFAZOLIN SODIUM-DEXTROSE 2-4 GM/100ML-% IV SOLN
INTRAVENOUS | Status: AC
  Filled 2024-11-05: qty 100

## 2024-11-05 MED ORDER — BUPIVACAINE-EPINEPHRINE 0.25% -1:200000 IJ SOLN
INTRAMUSCULAR | Status: DC | PRN
  Administered 2024-11-05: 30 mL

## 2024-11-05 MED ORDER — CHLORHEXIDINE GLUCONATE 0.12 % MT SOLN
OROMUCOSAL | Status: AC
  Filled 2024-11-05: qty 15

## 2024-11-05 MED ORDER — PROPOFOL 10 MG/ML IV BOLUS
INTRAVENOUS | Status: AC
  Filled 2024-11-05: qty 20

## 2024-11-05 MED ORDER — HYDROCODONE-ACETAMINOPHEN 5-325 MG PO TABS
1.0000 | ORAL_TABLET | Freq: Four times a day (QID) | ORAL | 0 refills | Status: AC | PRN
  Filled 2024-11-05: qty 12, 3d supply, fill #0

## 2024-11-05 MED ORDER — SUGAMMADEX SODIUM 200 MG/2ML IV SOLN
INTRAVENOUS | Status: DC | PRN
  Administered 2024-11-05: 150 mg via INTRAVENOUS

## 2024-11-05 MED ORDER — 0.9 % SODIUM CHLORIDE (POUR BTL) OPTIME
TOPICAL | Status: DC | PRN
  Administered 2024-11-05: 500 mL

## 2024-11-05 MED ORDER — FENTANYL CITRATE (PF) 100 MCG/2ML IJ SOLN
INTRAMUSCULAR | Status: DC | PRN
  Administered 2024-11-05 (×2): 50 ug via INTRAVENOUS

## 2024-11-05 MED ORDER — ONDANSETRON HCL 4 MG/2ML IJ SOLN
INTRAMUSCULAR | Status: AC
  Filled 2024-11-05: qty 2

## 2024-11-05 MED ORDER — LACTATED RINGERS IV SOLN: INTRAVENOUS | Status: DC

## 2024-11-05 MED ORDER — OXYCODONE HCL 5 MG PO TABS
5.0000 mg | ORAL_TABLET | Freq: Once | ORAL | Status: AC | PRN
  Administered 2024-11-05: 5 mg via ORAL

## 2024-11-05 MED ORDER — ORAL CARE MOUTH RINSE: 15.0000 mL | Freq: Once | OROMUCOSAL | Status: AC

## 2024-11-05 MED ORDER — DEXMEDETOMIDINE HCL IN NACL 80 MCG/20ML IV SOLN
INTRAVENOUS | Status: DC | PRN
  Administered 2024-11-05: 5 ug via INTRAVENOUS

## 2024-11-05 MED ORDER — DEXAMETHASONE SOD PHOSPHATE PF 10 MG/ML IJ SOLN
INTRAMUSCULAR | Status: DC | PRN
  Administered 2024-11-05: 10 mg via INTRAVENOUS

## 2024-11-05 MED ORDER — PROPOFOL 10 MG/ML IV BOLUS
INTRAVENOUS | Status: DC | PRN
  Administered 2024-11-05: 150 ug/kg/min via INTRAVENOUS
  Administered 2024-11-05: 110 mg via INTRAVENOUS

## 2024-11-05 MED ORDER — CHLORHEXIDINE GLUCONATE 0.12 % MT SOLN
15.0000 mL | Freq: Once | OROMUCOSAL | Status: AC
  Administered 2024-11-05: 15 mL via OROMUCOSAL

## 2024-11-05 MED ORDER — OXYCODONE HCL 5 MG PO TABS
ORAL_TABLET | ORAL | Status: AC
  Filled 2024-11-05: qty 1

## 2024-11-05 MED ORDER — ONDANSETRON HCL 4 MG/2ML IJ SOLN
INTRAMUSCULAR | Status: DC | PRN
  Administered 2024-11-05: 4 mg via INTRAVENOUS

## 2024-11-05 MED ORDER — FENTANYL CITRATE (PF) 100 MCG/2ML IJ SOLN
INTRAMUSCULAR | Status: AC
  Filled 2024-11-05: qty 2

## 2024-11-05 MED ORDER — OXYCODONE HCL 5 MG/5ML PO SOLN: 5.0000 mg | Freq: Once | ORAL | Status: AC | PRN

## 2024-11-05 MED ORDER — ACETAMINOPHEN 10 MG/ML IV SOLN
INTRAVENOUS | Status: DC | PRN
  Administered 2024-11-05: 1000 mg via INTRAVENOUS

## 2024-11-05 MED ORDER — MIDAZOLAM HCL 2 MG/2ML IJ SOLN
INTRAMUSCULAR | Status: AC
  Filled 2024-11-05: qty 2

## 2024-11-05 MED ORDER — ROCURONIUM BROMIDE 100 MG/10ML IV SOLN
INTRAVENOUS | Status: DC | PRN
  Administered 2024-11-05: 10 mg via INTRAVENOUS
  Administered 2024-11-05: 50 mg via INTRAVENOUS
  Administered 2024-11-05: 20 mg via INTRAVENOUS

## 2024-11-05 MED ORDER — FENTANYL CITRATE (PF) 100 MCG/2ML IJ SOLN
25.0000 ug | INTRAMUSCULAR | Status: DC | PRN
  Administered 2024-11-05 (×2): 25 ug via INTRAVENOUS

## 2024-11-05 SURGICAL SUPPLY — 33 items
BAG PRESSURE INF REUSE 1000 (BAG) IMPLANT
COVER TIP SHEARS 8 DVNC (MISCELLANEOUS) ×1 IMPLANT
COVER WAND RF STERILE (DRAPES) ×1 IMPLANT
DEFOGGER SCOPE WARM SEASHARP (MISCELLANEOUS) ×1 IMPLANT
DERMABOND ADVANCED .7 DNX12 (GAUZE/BANDAGES/DRESSINGS) ×1 IMPLANT
DRAPE ARM DVNC X/XI (DISPOSABLE) ×3 IMPLANT
DRAPE COLUMN DVNC XI (DISPOSABLE) ×1 IMPLANT
ELECTRODE REM PT RTRN 9FT ADLT (ELECTROSURGICAL) ×1 IMPLANT
FORCEPS BPLR FENES DVNC XI (FORCEP) ×1 IMPLANT
GLOVE BIO SURGEON STRL SZ 6.5 (GLOVE) ×2 IMPLANT
GLOVE BIOGEL PI IND STRL 6.5 (GLOVE) ×2 IMPLANT
GLOVE SURG SYN 6.5 PF PI (GLOVE) ×2 IMPLANT
GOWN STRL REUS W/ TWL LRG LVL3 (GOWN DISPOSABLE) ×4 IMPLANT
IRRIGATOR SUCT 8 DISP DVNC XI (IRRIGATION / IRRIGATOR) IMPLANT
IV 0.9% NACL 1000 ML (IV SOLUTION) IMPLANT
IV CATH ANGIO 12GX3 LT BLUE (NEEDLE) IMPLANT
KIT PINK PAD W/HEAD ARM REST (MISCELLANEOUS) ×1 IMPLANT
MANIFOLD NEPTUNE II (INSTRUMENTS) IMPLANT
MESH 3DMAX MID 5X7 RT XLRG (Mesh General) IMPLANT
NDL DRIVE SUT CUT DVNC (INSTRUMENTS) ×1 IMPLANT
NDL HYPO 22X1.5 SAFETY MO (MISCELLANEOUS) ×1 IMPLANT
NDL INSUFFLATION 14GA 120MM (NEEDLE) ×1 IMPLANT
NS IRRIG 500ML POUR BTL (IV SOLUTION) ×1 IMPLANT
OBTURATOR OPTICALSTD 8 DVNC (TROCAR) ×1 IMPLANT
PACK LAP CHOLECYSTECTOMY (MISCELLANEOUS) ×1 IMPLANT
SCISSORS MNPLR CVD DVNC XI (INSTRUMENTS) ×1 IMPLANT
SEAL UNIV 5-12 XI (MISCELLANEOUS) ×3 IMPLANT
SET TUBE SMOKE EVAC HIGH FLOW (TUBING) ×1 IMPLANT
SOLN STERILE WATER 500 ML (IV SOLUTION) IMPLANT
SOLUTION ELECTROSURG ANTI STCK (MISCELLANEOUS) ×1 IMPLANT
SUT STRATA 2-0 23CM CT-2 (SUTURE) ×1 IMPLANT
SUT VIC AB 2-0 SH 27XBRD (SUTURE) ×1 IMPLANT
SUTURE MNCRL 4-0 27XMF (SUTURE) ×1 IMPLANT

## 2024-11-05 NOTE — Discharge Instructions (Addendum)

## 2024-11-05 NOTE — Transfer of Care (Signed)
 Immediate Anesthesia Transfer of Care Note  Patient: Margaret Roberson  Procedure(s) Performed: REPAIR, HERNIA, INGUINAL, ROBOT-ASSISTED, LAPAROSCOPIC, USING MESH (Right: Inguinal)  Patient Location: PACU  Anesthesia Type:General  Level of Consciousness: drowsy and patient cooperative  Airway & Oxygen Therapy: Patient Spontanous Breathing and Patient connected to nasal cannula oxygen  Post-op Assessment: Report given to RN and Post -op Vital signs reviewed and stable  Post vital signs: Reviewed  Last Vitals:  Vitals Value Taken Time  BP 158/140 11/05/24 09:18  Temp 36.7 C 11/05/24 09:18  Pulse 81 11/05/24 09:18  Resp 18 11/05/24 09:18  SpO2 96 % 11/05/24 09:18  Vitals shown include unfiled device data.  Last Pain:  Vitals:   11/05/24 0918  TempSrc: Tympanic  PainSc: 0-No pain         Complications: No notable events documented.

## 2024-11-05 NOTE — Op Note (Signed)
 Preoperative diagnosis: Right inguinal hernia.   Postoperative diagnosis: Right inguinal hernia.  Procedure: Robotic assisted Laparoscopic Transabdominal preperitoneal laparoscopic (TAPP) repair of right inguinal hernia.  Anesthesia: GETA  Surgeon: Dr. Cesar Coe  Wound Classification: Clean  Indications:  Patient is a 68 y.o. female developed a symptomatic right inguinal hernia. Repair was indicated.  Findings: 1. Right indirect Inguinal hernia identified 2. Vas deferens and cord structures identified and preserved 3. Bard Extra Large 3D Max MID Anatomical mesh used for repair 4. Adequate hemostasis.   Description of procedure:  The patient was taken to the operating room and the correct side of surgery was verified. The patient was placed supine with right arm tucked at the side. After obtaining adequate anesthesia, the patients abdomen was prepped and draped in standard sterile fashion. A time-out was completed verifying correct patient, procedure, site, positioning, and implant(s) and/or special equipment prior to beginning this procedure.  An incision was made in a natural skin line above the umbilicus. The fascia was elevated and the Veress needle inserted. Proper position was confirmed by aspiration and saline meniscus test.  The abdomen was insufflated with carbon dioxide to a pressure of 15 mmHg. The patient tolerated insufflation well.  Abdominal cavity was entered using Optiview technique with a millimeter trocar.  No injury was identified.  Another 2 mm trocars were placed lateral to each rectus muscle.  Scissors and bipolar forceps were inserted under direct visualization. At the robotic console: Transverse peritoneal incision is made about 8 cm superior to the inguinal defect. Medial to the epigastric vessels, the parietal compartment is dissected to visualize the rectus muscle. This is carried down to the symphysis pubis and the retropubic space is dissected to expose at  least 2 cm contralateral to the midline. Coopers ligament is exposed and cleared at least 2 cm below the ligament to ensure adequate space for the inferior border of the mesh. Hesselbachs triangle is cleared assessing for a direct hernia. Lateral to the epigastric vessels, the dissection is carried out in visceral compartment continuing in the true preperitoneal plane. Indirect hernia sac, was carefully reduced and separated from the cord structures with medial retraction and a combination of blunt/sharp dissection and focused cautery. This dissection was continued until the cord structures are parietalized completely, allowing for visualization of the reflected peritoneum that is continuous with the line originating 2 cm below Coopers medially and across the psoas muscle in the lateral compartment.  The internal ring was interrogated for a cord lipoma. The cord lipoma was reduced to the retroperitoneum and seated dorsal to the preperitoneal mesh. Having achieved a complete dissection with a critical view of the entire myopectineal orifice, an XL mesh was then positioned centered at the iliopubic tract with the medial side crossing the midline and the inferior edge positioned 2 cm below Coopers ligament. The lateral aspect of the mesh extended 3-5 cm beyond the lateral edge of the psoas. The mesh is fixated using an interrupted suture placed to the ipsilateral Coopers ligament. A second suture was done at the medial superior aspect of the mesh fixating this to the rectus complex.  The peritoneal flap is closed with running barbed suture. Additional holes in the peritoneum closed with suture. Preperitoneal space gas aspirated to visualize the peritoneum apposed directly against the mesh and ensure no folding, lifting, or buckling of the mesh. Skin is closed, sterile dressings are applied.  The patient tolerated the procedure well and was taken to the postanesthesia care unit in stable  condition  Specimen:  None  Complications: None  Estimated Blood Loss: 10 mL

## 2024-11-05 NOTE — Interval H&P Note (Signed)
 History and Physical Interval Note:  11/05/2024 7:14 AM  Margaret Roberson  has presented today for surgery, with the diagnosis of K40.90 non recurrent unilateral inguinal hernia w/o obstruction or gangrene.  The various methods of treatment have been discussed with the patient and family. After consideration of risks, benefits and other options for treatment, the patient has consented to  Procedure(s): REPAIR, HERNIA, INGUINAL, ROBOT-ASSISTED, LAPAROSCOPIC, USING MESH (Right vs bilateral) as a surgical intervention.  The patient's history has been reviewed, patient examined, no change in status, stable for surgery.  I have reviewed the patient's chart and labs.  Questions were answered to the patient's satisfaction.     [name]

## 2024-11-05 NOTE — Anesthesia Preprocedure Evaluation (Signed)
 Anesthesia Evaluation  Patient identified by MRN, date of birth, ID band Patient awake    Reviewed: Allergy & Precautions, NPO status , Patient's Chart, lab work & pertinent test results  History of Anesthesia Complications (+) PONV and history of anesthetic complications  Airway Mallampati: III  TM Distance: <3 FB Neck ROM: full    Dental  (+) Chipped   Pulmonary sleep apnea    Pulmonary exam normal        Cardiovascular negative cardio ROS Normal cardiovascular exam     Neuro/Psych  Headaches PSYCHIATRIC DISORDERS         GI/Hepatic Neg liver ROS,GERD  Controlled,,  Endo/Other  negative endocrine ROS    Renal/GU      Musculoskeletal   Abdominal   Peds  Hematology negative hematology ROS (+)   Anesthesia Other Findings Past Medical History: No date: Anemia No date: Anxiety No date: Atypical chest pain 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: Bowel trouble 03/09/2014: Cervicalgia No date: Depression No date: Diffuse cystic mastopathy No date: Dyspnea     Comment:  cannot walk a mile without getting sob No date: Family history of adverse reaction to anesthesia     Comment:  cousin on dad's side died from malignant hyperthermia in              the 1970's No date: Family history of malignant neoplasm of breast No date: Family history of malignant neoplasm of gastrointestinal  tract No date: Fibrocystic breast changes No date: GERD (gastroesophageal reflux disease) No date: Gluten intolerance 2004: Gout     Comment:  pseudogout No date: Headache disorder     Comment:  R sided, etiology unknown  No date: HLD (hyperlipidemia) No date: Insomnia No date: Loss of memory 2012: Lump or mass in breast No date: Osteopenia No date: Parkinsonian features No date: PONV (postoperative nausea and vomiting) No date: Pseudogout No date: Right inguinal hernia No date: Sleep apnea No date: Urinary  incontinence No date: Uterine prolapse No date: Vitamin D deficiency  Past Surgical History: No date: ABDOMINAL HYSTERECTOMY 2009: BIOPSY BREAST     Comment:  R 2006: BREAST BIOPSY     Comment:  core bx 1992: BREAST LUMPECTOMY     Comment:  L; benign 1994: CESAREAN SECTION No date: CHOLECYSTECTOMY 2008: COLONOSCOPY     Comment:  Dr. Dellie 05/06/2021: COLONOSCOPY WITH PROPOFOL ; N/A     Comment:  Procedure: COLONOSCOPY WITH PROPOFOL ;  Surgeon:               Maryruth Ole DASEN, MD;  Location: [facility] ENDOSCOPY;                Service: Endoscopy;  Laterality: N/A; No date: COLPOPEXY 03/22/2015: COLPORRHAPHY No date: CYSTOURETHROSCOPY 05/06/2021: ESOPHAGOGASTRODUODENOSCOPY; N/A     Comment:  Procedure: ESOPHAGOGASTRODUODENOSCOPY (EGD);  Surgeon:               Maryruth Ole DASEN, MD;  Location: [facility] ENDOSCOPY;                Service: Endoscopy;  Laterality: N/A; 10/04/2016: ESOPHAGOGASTRODUODENOSCOPY (EGD) WITH PROPOFOL ; N/A     Comment:  Procedure: ESOPHAGOGASTRODUODENOSCOPY (EGD) WITH               PROPOFOL ;  Surgeon: Lamar DASEN Holmes, MD;  Location: [facility]              ENDOSCOPY;  Service: Endoscopy;  Laterality: N/A; 06/02/2022: ESOPHAGOGASTRODUODENOSCOPY (EGD) WITH PROPOFOL ; N/A     Comment:  Procedure:  ESOPHAGOGASTRODUODENOSCOPY (EGD) WITH               PROPOFOL ;  Surgeon: Maryruth Ole DASEN, MD;  Location:               [facility] ENDOSCOPY;  Service: Endoscopy;  Laterality: N/A; No date: sling for stress incontinence     Reproductive/Obstetrics negative OB ROS                              Anesthesia Physical Anesthesia Plan  ASA: 3  Anesthesia Plan: General ETT   Post-op Pain Management:    Induction: Intravenous  PONV Risk Score and Plan: Ondansetron , Dexamethasone , Midazolam  and Treatment may vary due to age or medical condition  Airway Management Planned: Oral ETT  Additional Equipment:   Intra-op Plan:   Post-operative Plan:  Extubation in OR  Informed Consent: I have reviewed the patients History and Physical, chart, labs and discussed the procedure including the risks, benefits and alternatives for the proposed anesthesia with the patient or authorized representative who has indicated his/her understanding and acceptance.     Dental Advisory Given  Plan Discussed with: Anesthesiologist, CRNA and Surgeon  Anesthesia Plan Comments: (Patient consented for risks of anesthesia including but not limited to:  - adverse reactions to medications - damage to eyes, teeth, lips or other oral mucosa - nerve damage due to positioning  - sore throat or hoarseness - Damage to heart, brain, nerves, lungs, other parts of body or loss of life  Patient voiced understanding and assent.)        Anesthesia Quick Evaluation

## 2024-11-05 NOTE — Anesthesia Procedure Notes (Addendum)
 Procedure Name: Intubation Date/Time: 11/05/2024 7:42 AM  Performed by: Lorrene Camelia LABOR, CRNAPre-anesthesia Checklist: Patient identified, Patient being monitored, Timeout performed, Emergency Drugs available and Suction available Patient Re-evaluated:Patient Re-evaluated prior to induction Oxygen Delivery Method: Circle system utilized Preoxygenation: Pre-oxygenation with 100% oxygen Induction Type: IV induction Ventilation: Mask ventilation without difficulty and Oral airway inserted - appropriate to patient size Laryngoscope Size: 3 and McGrath Grade View: Grade I Tube type: Oral Tube size: 6.5 mm Number of attempts: 1 Airway Equipment and Method: Stylet Placement Confirmation: ETT inserted through vocal cords under direct vision, positive ETCO2 and breath sounds checked- equal and bilateral Secured at: 20 cm Tube secured with: Tape Dental Injury: Teeth and Oropharynx as per pre-operative assessment

## 2024-11-05 NOTE — Anesthesia Postprocedure Evaluation (Signed)
 Anesthesia Post Note  Patient: Margaret Roberson  Procedure(s) Performed: REPAIR, HERNIA, INGUINAL, ROBOT-ASSISTED, LAPAROSCOPIC, USING MESH (Right: Inguinal)  Patient location during evaluation: PACU Anesthesia Type: General Level of consciousness: awake and alert Pain management: pain level controlled Vital Signs Assessment: post-procedure vital signs reviewed and stable Respiratory status: spontaneous breathing, nonlabored ventilation and respiratory function stable Cardiovascular status: blood pressure returned to baseline and stable Postop Assessment: no apparent nausea or vomiting Anesthetic complications: no   No notable events documented.   Last Vitals:  Vitals:   11/05/24 0952 11/05/24 1002  BP:  (!) 154/67  Pulse: 73 66  Resp: 20 15  Temp:  36.7 C  SpO2: 95% 93%    Last Pain:  Vitals:   11/05/24 1002  TempSrc: Temporal  PainSc: 4                  Fairy POUR Yoshi Mancillas

## 2024-11-06 ENCOUNTER — Encounter: Payer: Self-pay | Admitting: General Surgery

## 2024-11-12 ENCOUNTER — Inpatient Hospital Stay: Admission: RE | Admit: 2024-11-12 | Discharge: 2024-11-12 | Attending: General Surgery

## 2024-11-12 DIAGNOSIS — R928 Other abnormal and inconclusive findings on diagnostic imaging of breast: Secondary | ICD-10-CM | POA: Diagnosis present

## 2024-11-12 DIAGNOSIS — N641 Fat necrosis of breast: Secondary | ICD-10-CM | POA: Insufficient documentation

## 2024-11-12 DIAGNOSIS — N63 Unspecified lump in unspecified breast: Secondary | ICD-10-CM | POA: Insufficient documentation

## 2024-11-12 HISTORY — PX: BREAST BIOPSY: SHX20

## 2024-11-12 MED ORDER — LIDOCAINE 1 % OPTIME INJ - NO CHARGE
2.0000 mL | Freq: Once | INTRAMUSCULAR | Status: AC
  Administered 2024-11-12: 09:00:00 2 mL via INTRADERMAL
  Filled 2024-11-12: qty 2

## 2024-11-12 MED ORDER — LIDOCAINE-EPINEPHRINE 1 %-1:100000 IJ SOLN
10.0000 mL | Freq: Once | INTRAMUSCULAR | Status: AC
  Administered 2024-11-12: 09:00:00 10 mL via INTRADERMAL
  Filled 2024-11-12: qty 10

## 2024-11-13 LAB — SURGICAL PATHOLOGY
